# Patient Record
Sex: Female | Born: 1965
Health system: Southern US, Community
[De-identification: ages and names within clinical notes are randomized; demographics above are authoritative.]

## PROBLEM LIST (undated history)

## (undated) DIAGNOSIS — K219 Gastro-esophageal reflux disease without esophagitis: Secondary | ICD-10-CM

## (undated) DIAGNOSIS — T7840XA Allergy, unspecified, initial encounter: Secondary | ICD-10-CM

## (undated) DIAGNOSIS — IMO0001 Reserved for inherently not codable concepts without codable children: Secondary | ICD-10-CM

## (undated) DIAGNOSIS — F419 Anxiety disorder, unspecified: Secondary | ICD-10-CM

## (undated) DIAGNOSIS — R7611 Nonspecific reaction to tuberculin skin test without active tuberculosis: Secondary | ICD-10-CM

## (undated) HISTORY — DX: Reserved for inherently not codable concepts without codable children: IMO0001

## (undated) HISTORY — DX: Anxiety disorder, unspecified: F41.9

## (undated) HISTORY — DX: Allergy, unspecified, initial encounter: T78.40XA

## (undated) HISTORY — DX: Nonspecific reaction to tuberculin skin test without active tuberculosis: R76.11

## (undated) HISTORY — DX: Gastro-esophageal reflux disease without esophagitis: K21.9

---

## 2009-10-14 ENCOUNTER — Encounter: Admission: RE | Admit: 2009-10-14 | Discharge: 2009-10-14 | Payer: Self-pay | Admitting: Emergency Medicine

## 2010-08-03 ENCOUNTER — Encounter: Payer: Self-pay | Admitting: Emergency Medicine

## 2011-06-22 ENCOUNTER — Encounter (INDEPENDENT_AMBULATORY_CARE_PROVIDER_SITE_OTHER): Payer: BC Managed Care – PPO | Admitting: Physician Assistant

## 2011-06-22 DIAGNOSIS — Z0000 Encounter for general adult medical examination without abnormal findings: Secondary | ICD-10-CM

## 2011-08-30 ENCOUNTER — Ambulatory Visit: Payer: BC Managed Care – PPO | Admitting: Family Medicine

## 2011-08-30 DIAGNOSIS — Z9289 Personal history of other medical treatment: Secondary | ICD-10-CM

## 2011-08-30 DIAGNOSIS — J0100 Acute maxillary sinusitis, unspecified: Secondary | ICD-10-CM

## 2011-08-30 DIAGNOSIS — J329 Chronic sinusitis, unspecified: Secondary | ICD-10-CM

## 2011-08-30 DIAGNOSIS — R7611 Nonspecific reaction to tuberculin skin test without active tuberculosis: Secondary | ICD-10-CM

## 2011-08-30 DIAGNOSIS — Z228 Carrier of other infectious diseases: Secondary | ICD-10-CM

## 2011-08-30 MED ORDER — AMOXICILLIN 875 MG PO TABS: 875.0000 mg | ORAL_TABLET | Freq: Two times a day (BID) | ORAL | Status: AC

## 2011-08-30 MED ORDER — BENZONATATE 100 MG PO CAPS: 100.0000 mg | ORAL_CAPSULE | Freq: Two times a day (BID) | ORAL | Status: AC | PRN

## 2011-08-30 MED ORDER — FLUTICASONE PROPIONATE 50 MCG/ACT NA SUSP: 2.0000 | Freq: Every day | NASAL | Status: DC

## 2011-08-30 NOTE — Progress Notes (Signed)
  Subjective:    Patient ID: Terri Wall, female    DOB: 1966-04-08, 46 y.o.   MRN: [id number]  Cough This is a recurrent problem. The current episode started 1 to 4 weeks ago. The problem has been waxing and waning. The problem occurs every few hours. The cough is non-productive. Associated symptoms include postnasal drip and a sore throat. Pertinent negatives include no fever, nasal congestion or rhinorrhea.  Cough preceded by URI  Acknowledges GERD symptoms and at times nocturnal cough if patient indulges in fried foods or eats too late in the evening.(Dr. Elnoria Howard)  Denies wheezing  Works in Customer service manager  History of +PPD in the past.  Husband concerned that current symptoms may represent activation of latent TB. 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  Review of Systems  Constitutional: Negative for fever.  HENT: Positive for sore throat and postnasal drip. Negative for rhinorrhea.   Respiratory: Positive for cough.        Objective:   Physical Exam  Constitutional: She appears well-developed and well-nourished.  HENT:  Right Ear: Tympanic membrane is retracted.  Left Ear: Tympanic membrane is retracted.  Nose: Mucosal edema: inflammation of the nasal turbinates.  Mouth/Throat: Oropharyngeal exudate: purulent pnd.  Eyes: Conjunctivae are normal.  Cardiovascular: Normal rate, regular rhythm and normal heart sounds.   Pulmonary/Chest: Effort normal and breath sounds normal.  Lymphadenopathy:    She has cervical adenopathy.  Neurological: She is alert.  Skin: Skin is warm.  Psychiatric: She has a normal mood and affect.          Assessment & Plan:   1. Sinusitis  fluticasone (FLONASE) 50 MCG/ACT nasal spray, benzonatate (TESSALON) 100 MG capsule, amoxicillin (AMOXIL) 875 MG tablet  2. History of positive PPD  Dicussed drawing interferon TB gold to discern whether TB is latent or cross  reactivity with BCG.  Secondary to the language barrier I asked patient to RTC with her husband to discuss with Dr. Lindi Adie MD) their concerns regarding +PPD. Patient in agreement with this plan.

## 2011-08-30 NOTE — Patient Instructions (Signed)
Return to see Dr. Cleta Alberts in 2 weeks after completing antibiotics to discuss you and your husbands questions regarding your positive TB skin test in the past.

## 2011-09-12 ENCOUNTER — Other Ambulatory Visit: Payer: Self-pay | Admitting: Family Medicine

## 2011-09-20 ENCOUNTER — Ambulatory Visit (INDEPENDENT_AMBULATORY_CARE_PROVIDER_SITE_OTHER): Payer: BC Managed Care – PPO | Admitting: Internal Medicine

## 2011-09-20 VITALS — BP 98/61 | HR 75 | Temp 98.0°F | Resp 16 | Ht 61.5 in | Wt 95.0 lb

## 2011-09-20 DIAGNOSIS — R05 Cough: Secondary | ICD-10-CM

## 2011-09-20 DIAGNOSIS — E559 Vitamin D deficiency, unspecified: Secondary | ICD-10-CM | POA: Insufficient documentation

## 2011-09-20 DIAGNOSIS — R059 Cough, unspecified: Secondary | ICD-10-CM

## 2011-09-20 DIAGNOSIS — J329 Chronic sinusitis, unspecified: Secondary | ICD-10-CM

## 2011-09-20 MED ORDER — AMOXICILLIN 500 MG PO CAPS: 1000.0000 mg | ORAL_CAPSULE | Freq: Two times a day (BID) | ORAL | Status: AC

## 2011-09-20 MED ORDER — BENZONATATE 200 MG PO CAPS: 200.0000 mg | ORAL_CAPSULE | Freq: Two times a day (BID) | ORAL | Status: AC | PRN

## 2011-09-20 NOTE — Progress Notes (Signed)
  Subjective:    Patient ID: Terri Wall, female    DOB: 1966-03-06, 46 y.o.   MRN: [id number]  HPI Pesistent sinusitis and cough Is vit. D deficient, now taking vit. d   Review of Systems Stable    Objective:   Physical Exam  Mild sinus tenderness      Assessment & Plan:   Sinusitis Cough Vitamin D def.  Amoxil rx Tesslon rx Continue vit d

## 2011-11-09 ENCOUNTER — Ambulatory Visit: Payer: BC Managed Care – PPO

## 2011-11-09 ENCOUNTER — Ambulatory Visit (INDEPENDENT_AMBULATORY_CARE_PROVIDER_SITE_OTHER): Payer: BC Managed Care – PPO | Admitting: Family Medicine

## 2011-11-09 VITALS — BP 110/71 | HR 91 | Temp 98.4°F | Resp 16 | Ht 61.5 in | Wt 94.0 lb

## 2011-11-09 DIAGNOSIS — J40 Bronchitis, not specified as acute or chronic: Secondary | ICD-10-CM

## 2011-11-09 DIAGNOSIS — K219 Gastro-esophageal reflux disease without esophagitis: Secondary | ICD-10-CM

## 2011-11-09 DIAGNOSIS — R059 Cough, unspecified: Secondary | ICD-10-CM

## 2011-11-09 DIAGNOSIS — R05 Cough: Secondary | ICD-10-CM

## 2011-11-09 MED ORDER — AZITHROMYCIN 250 MG PO TABS: ORAL_TABLET | ORAL | Status: AC

## 2011-11-09 MED ORDER — HYDROCODONE-HOMATROPINE 5-1.5 MG/5ML PO SYRP: 5.0000 mL | ORAL_SOLUTION | Freq: Four times a day (QID) | ORAL | Status: AC | PRN

## 2011-11-09 NOTE — Patient Instructions (Signed)
Bronchitis Bronchitis is the body's way of reacting to injury and/or infection (inflammation) of the bronchi. Bronchi are the air tubes that extend from the windpipe into the lungs. If the inflammation becomes severe, it may cause shortness of breath. CAUSES  Inflammation may be caused by:  A virus.   Germs (bacteria).   Dust.   Allergens.   Pollutants and many other irritants.  The cells lining the bronchial tree are covered with tiny hairs (cilia). These constantly beat upward, away from the lungs, toward the mouth. This keeps the lungs free of pollutants. When these cells become too irritated and are unable to do their job, mucus begins to develop. This causes the characteristic cough of bronchitis. The cough clears the lungs when the cilia are unable to do their job. Without either of these protective mechanisms, the mucus would settle in the lungs. Then you would develop pneumonia. Smoking is a common cause of bronchitis and can contribute to pneumonia. Stopping this habit is the single most important thing you can do to help yourself. TREATMENT   Your caregiver may prescribe an antibiotic if the cough is caused by bacteria. Also, medicines that open up your airways make it easier to breathe. Your caregiver may also recommend or prescribe an expectorant. It will loosen the mucus to be coughed up. Only take over-the-counter or prescription medicines for pain, discomfort, or fever as directed by your caregiver.   Removing whatever causes the problem (smoking, for example) is critical to preventing the problem from getting worse.   Cough suppressants may be prescribed for relief of cough symptoms.   Inhaled medicines may be prescribed to help with symptoms now and to help prevent problems from returning.   For those with recurrent (chronic) bronchitis, there may be a need for steroid medicines.  SEEK IMMEDIATE MEDICAL CARE IF:   During treatment, you develop more pus-like mucus  (purulent sputum).   You have a fever.   Your baby is older than 3 months with a rectal temperature of 102 F (38.9 C) or higher.   Your baby is 52 months old or younger with a rectal temperature of 100.4 F (38 C) or higher.   You become progressively more ill.   You have increased difficulty breathing, wheezing, or shortness of breath.  It is necessary to seek immediate medical care if you are elderly or sick from any other disease. MAKE SURE YOU:   Understand these instructions.   Will watch your condition.   Will get help right away if you are not doing well or get worse.  Document Released: 06/29/2005 Document Revised: 06/18/2011 Document Reviewed: 05/08/2008 [facility] Patient Information 2012 [place], [place].Vim ph? qu?n (Bronchitis) Vim ph? qu?n l cch c? th? ph?n ?ng l?i s? t?n th??ng v/ho?c nhi?m trng (vim) c?a ph? qu?n. Ph? qu?n l ?ng d?n kh ko di t? kh qu?n ??n ph?i. Khi vim nhi?m n?ng c th? gy kh th?. NGUYN NHN S? vim nhi?m c th? gy ra b?i:  Vi rt.   Vi trng (vi khu?n).   B?i.   Ch?t gy d? ?ng.   Ch?t b?n v nhi?u ch?t kch thch khc.  Cc t? bo t?o thnh cu?ng ph?i ???c bao b?c b?i lng t? (mao). Nh?ng lng t? ny lin t?c ??p ln ra xa ph?i v? pha mi?ng. Nh? ? ng?n khng cho cc ch?t b?n ?i vo ph?i. Khi nh?ng t? bo ny tr? nn qu kch thch v khng th? th?c hi?n ch?c n?ng c?a chng,  d?ch nh?y s? b?t ??u pht tri?n. ?i?u ny gy ra ho ??c tr?ng c?a vim ph? qu?n. Ho s? lm s?ch ph?i khi lng mao khng th? th?c hi?n ch?c n?ng c?a chng. Khi khng c c? hai c? c?u b?o v? ny, d?ch nh?y s? ??ng l?i trong ph?i. Khi ? vim ph?i s? pht tri?n.  Ht thu?c l l nguyn nhn ph? bi?n c?a vim ph? qu?n v c th? gp ph?n vo vim ph?i. Vi?c d?ng thi quen ny l m?t ?i?u quan tr?ng nh?t m b?n c th? lm ?? t? gip mnh. ?I?U TR?  Chuyn gia ch?m [place] y t? c th? k thu?c khng sinh cho b?n n?u b?n b? ho do vi khu?n. Ngoi ra, thu?c gip lm  gin kh qu?n ?? b?n th? d? h?n. Chuyn gia ch?m [place] y t? c?ng c th? ?? xu?t ho?c k thu?c long ??m. Thu?c long ??m s? lm l?ng d?ch nh?y ?? c th? ho ra. Ch? s? d?ng thu?c mua tr?c ti?p t?i qu?y ho?c thu?c theo toa ?? gi?m ?au, gi?m s? kh ch?u ho?c h? s?t theo ch? d?n c?a chuyn gia ch?m [place] y t? c?a b?n.   Vi?c lo?i b? b?t c? ?i?u g gy ra v?n ?? (v d? nh? ht thu?c l) c  ngh?a quan tr?ng ??i v?i vi?c ng?n khng lm cho v?n ?? t?i t? h?n.   Thu?c ho c th? ???c k ?? ?i?u tr? tri?u ch?ng ho.   Thu?c ht c th? ???c k ?? gip ?i?u tr? cc tri?u ch?ng hi?n t?i v ?? gip ng?n ng?a v?n ?? l?p l?i.   Nh?ng ng??i b? vim ph? qu?n mn tnh (ti h?i) c th? c?n ph?i dng thu?c steroid.  HY NGAY L?P T?C THAM V?N V?I CHUYN GIA Y T? N?U:  Trong qu trnh ?i?u tr? b?n pht tri?n ??m gi?ng m? h?n (c m?).   B?n b? s?t.   Tr? h?n 3 thng tu?i c nhi?t ?? ?o ? h?u mn l 102 F (38,9 C) ho?c cao h?n.   Tr? 3 thng tu?i ho?c nh? h?n c nhi?t ?? ?o ? h?u mn l 100,4 F (38 C) ho?c cao h?n.   Tnh tr?ng b?nh c?a b?n tr? nn n?ng h?n.   B?n th?y kh th? h?n, th? kh kh hay kh th?.  C?n tham v?n v?i chuyn gia y t? ngay l?p t?c n?u b?n l ng??i l?n tu?i ho?c ?ang b? b?t k? b?nh no khc. ??M B?O B?N:   Hi?u cc h??ng d?n ny.   S? theo di tnh tr?ng c?a mnh.   S? yu c?u tr? gip ngay l?p t?c n?u b?n c?m th?y khng kh?e ho?c tnh tr?ng tr? nn t?i t? h?n.  Document Released: 06/29/2005 Document Revised: 06/18/2011 [facility] Patient Information 2012 [place], [place].

## 2011-11-09 NOTE — Progress Notes (Signed)
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  She presents with worsening cough x 3 weeks.  No known fever, but has had recent chills.  Has taken claritin without much help. Associated with general weakness. Patient notes acid reflux but has carafate for that  O:  NAD Chest:  Few rales right base Tm's:  Old scars Oroph:  Clear Heart:  Reg, no murmur Neck:  Supple, no adenopathy cervical or supraclav nodes. [facility] reading (PRIMARY) by  Dr. Milus Glazier - CXR:  No infiltrates  A:  Acute bronchitis, language barrier.  P:  z pak and hydromet

## 2012-05-02 ENCOUNTER — Ambulatory Visit (INDEPENDENT_AMBULATORY_CARE_PROVIDER_SITE_OTHER): Payer: BC Managed Care – PPO | Admitting: Family Medicine

## 2012-05-02 DIAGNOSIS — Z23 Encounter for immunization: Secondary | ICD-10-CM

## 2012-11-07 ENCOUNTER — Ambulatory Visit (INDEPENDENT_AMBULATORY_CARE_PROVIDER_SITE_OTHER): Payer: BC Managed Care – PPO | Admitting: Family Medicine

## 2012-11-07 VITALS — BP 102/70 | HR 71 | Temp 97.6°F | Resp 16 | Ht 63.8 in | Wt 97.0 lb

## 2012-11-07 DIAGNOSIS — E559 Vitamin D deficiency, unspecified: Secondary | ICD-10-CM

## 2012-11-07 DIAGNOSIS — Z0000 Encounter for general adult medical examination without abnormal findings: Secondary | ICD-10-CM

## 2012-11-07 LAB — POCT CBC
Granulocyte percent: 58.1 %G (ref 37–80)
HCT, POC: 43.2 % (ref 37.7–47.9)
Lymph, poc: 1.8 (ref 0.6–3.4)
MCHC: 33.6 g/dL (ref 31.8–35.4)
MID (cbc): 0.4 (ref 0–0.9)
POC LYMPH PERCENT: 34.5 %L (ref 10–50)
Platelet Count, POC: 253 10*3/uL (ref 142–424)
RBC: 4.73 M/uL (ref 4.04–5.48)
RDW, POC: 13.1 %

## 2012-11-07 LAB — COMPREHENSIVE METABOLIC PANEL
ALT: 9 U/L (ref 0–35)
Calcium: 9.6 mg/dL (ref 8.4–10.5)
Chloride: 106 mEq/L (ref 96–112)
Creat: 0.58 mg/dL (ref 0.50–1.10)
Sodium: 140 mEq/L (ref 135–145)
Total Bilirubin: 1.2 mg/dL (ref 0.3–1.2)
Total Protein: 6.9 g/dL (ref 6.0–8.3)

## 2012-11-07 LAB — LIPID PANEL
Cholesterol: 184 mg/dL (ref 0–200)
HDL: 69 mg/dL (ref >39)
LDL Cholesterol: 101 mg/dL — ABNORMAL HIGH (ref 0–99)
Total CHOL/HDL Ratio: 2.7 Ratio

## 2012-11-07 LAB — TSH: TSH: 1.048 u[IU]/mL (ref 0.350–4.500)

## 2012-11-07 NOTE — Progress Notes (Signed)
  Subjective:    Patient ID: Terri Wall, female    DOB: 1966/04/10, 47 y.o.   MRN: [id number]  HPI  Had mammogram last year, was normal.  Reports she had pap smear done last year here and all prior have been normal.  Periods are normal and regular.  Does do monthly self-breast exams. Takes multivitamin daily.  Takes vit D supplement for a while but stopped and getting plenty of sun.      Review of Systems    BP 102/70  Pulse 71  Temp(Src) 97.6 F (36.4 C) (Oral)  Resp 16  Ht 5' 3.8" (1.621 m)  Wt 97 lb (43.999 kg)  BMI 16.74 kg/m2  SpO2 100%  LMP 10/16/2012 Objective:   Physical Exam        Assessment & Plan:  tdap 2007, last normal pap was Dec 2012 so recehck Dec 2015 - 1 1/2 yrs.

## 2012-11-07 NOTE — Patient Instructions (Addendum)
You will need your pap smear repeated in 1 1/2 yrs - around December 2015.  You will need a tetanus shot 2017.    Keeping You Healthy  Get These Tests 1. Blood Pressure- Have your blood pressure checked once a year by your health care provider.  Normal blood pressure is 120/80. 2. Weight- Have your body mass index (BMI) calculated to screen for obesity.  BMI is measure of body fat based on height and weight.  You can also calculate your own BMI at https://www.west-esparza.com/. 3. Cholesterol- Have your cholesterol checked every 5 years starting at age 63 then yearly starting at age 47. 4. Chlamydia, HIV, and other sexually transmitted diseases- Get screened every year until age 69, then within three months of each new sexual provider. 5. Pap Smear- Every 3 years; discuss with your health care provider. 6. Mammogram- Every year starting at age 7  Take these medicines  Calcium with Vitamin D-Your body needs 1200 mg of Calcium each day and [phone] IU of Vitamin D daily.  Your body can only absorb 500 mg of Calcium at a time so Calcium must be taken in 2 or 3 divided doses throughout the day.  Multivitamin with folic acid- Once daily if it is possible for you to become pregnant.  Get these Immunizations  Gardasil-Series of three doses; prevents HPV related illness such as genital warts and cervical cancer.  Menactra-Single dose; prevents meningitis.  Tetanus shot- Every 10 years.  Flu shot-Every year.  Take these steps 1. Do not smoke-Your healthcare provider can help you quit.  For tips on how to quit go to www.smokefree.gov or call 1-800 QUITNOW. 2. Be physically active- Exercise 5 days a week for at least 30 minutes.  If you are not already physically active, start slow and gradually work up to 30 minutes of moderate physical activity.  Examples of moderate activity include walking briskly, dancing, swimming, bicycling, etc. 3. Breast Cancer- A self breast exam every month is important  for early detection of breast cancer.  For more information and instruction on self breast exams, ask your healthcare provider or [place]. 4. Eat a healthy diet- Eat a variety of healthy foods such as fruits, vegetables, whole grains, low fat milk, low fat cheeses, yogurt, lean meats, poultry and fish, beans, nuts, tofu, etc.  For more information go to www. Thenutritionsource.org 5. Drink alcohol in moderation- Limit alcohol intake to one drink or less per day. Never drink and drive. 6. Depression- Your emotional health is as important as your physical health.  If you're feeling down or losing interest in things you normally enjoy please talk to your healthcare provider about being screened for depression. 7. Dental visit- Brush and floss your teeth twice daily; visit your dentist twice a year. 8. Eye doctor- Get an eye exam at least every 2 years. 9. Helmet use- Always wear a helmet when riding a bicycle, motorcycle, rollerblading or skateboarding. 10. Safe sex- If you may be exposed to sexually transmitted infections, use a condom. 11. Seat belts- Seat belts can save your live; always wear one. 12. Smoke/Carbon Monoxide detectors- These detectors need to be installed on the appropriate level of your home. Replace batteries at least once a year. 13. Skin cancer- When out in the sun please cover up and use sunscreen 15 SPF or higher. 14. Violence- If anyone is threatening or hurting you, please tell your healthcare provider.

## 2012-11-08 LAB — VITAMIN D 25 HYDROXY (VIT D DEFICIENCY, FRACTURES): Vit D, 25-Hydroxy: 32 ng/mL (ref 30–89)

## 2012-11-12 ENCOUNTER — Telehealth: Payer: Self-pay

## 2012-11-12 NOTE — Telephone Encounter (Signed)
Patient husband returning call about labs results. Cb# [phone]

## 2012-11-12 NOTE — Telephone Encounter (Signed)
See labs 

## 2013-04-25 ENCOUNTER — Ambulatory Visit (INDEPENDENT_AMBULATORY_CARE_PROVIDER_SITE_OTHER): Payer: BC Managed Care – PPO | Admitting: Family Medicine

## 2013-04-25 DIAGNOSIS — Z23 Encounter for immunization: Secondary | ICD-10-CM

## 2013-11-21 ENCOUNTER — Ambulatory Visit: Payer: BC Managed Care – PPO | Admitting: Family Medicine

## 2013-11-21 VITALS — BP 110/70 | HR 72 | Temp 97.8°F | Resp 16 | Ht 61.5 in | Wt 97.2 lb

## 2013-11-21 DIAGNOSIS — R7611 Nonspecific reaction to tuberculin skin test without active tuberculosis: Secondary | ICD-10-CM

## 2013-11-21 DIAGNOSIS — R05 Cough: Secondary | ICD-10-CM

## 2013-11-21 DIAGNOSIS — R059 Cough, unspecified: Secondary | ICD-10-CM

## 2013-11-21 DIAGNOSIS — K219 Gastro-esophageal reflux disease without esophagitis: Secondary | ICD-10-CM

## 2013-11-21 MED ORDER — HYDROCODONE-HOMATROPINE 5-1.5 MG/5ML PO SYRP: 5.0000 mL | ORAL_SOLUTION | Freq: Every evening | ORAL | Status: DC | PRN

## 2013-11-21 MED ORDER — RANITIDINE HCL 150 MG PO TABS: 150.0000 mg | ORAL_TABLET | Freq: Two times a day (BID) | ORAL | Status: DC | PRN

## 2013-11-21 MED ORDER — BENZONATATE 200 MG PO CAPS: 200.0000 mg | ORAL_CAPSULE | Freq: Three times a day (TID) | ORAL | Status: DC | PRN

## 2013-11-21 MED ORDER — CETIRIZINE HCL 10 MG PO TABS: 10.0000 mg | ORAL_TABLET | Freq: Every day | ORAL | Status: DC

## 2013-11-21 NOTE — Progress Notes (Addendum)
This chart was scribed for Terri SorensonEva Garron Eline MD by Terri ConchStephen Wall, ED Scribe. This patient was seen in room 13 and the patient's care was started at 12:24 PM .  Subjective:    Patient ID: Terri CousinsNgoc Wall, female    DOB: [birth year], 48 y.o.   MRN: [id number]   Chief Complaint  Patient presents with  . Cough    x 3 days    HPI   HPI Comments: Terri Wall is a 48 y.o. female who presents to the [facility] complaining of a dry cough that has been present for 3 days and is worsened by activity. She does not feel ill.  Maybe some slight nasal pressure but no congestion or PND. She denies any ShOB, fever, sinus pain, sleep disturbance, and wheezes. She has been taking OTC similar to NyQuil and reports that it provides some effect. She reports having taking a ZPAC that has provided effect in the past.   She also reports that she has GERD and that it causes her throat to be sore. She states that her "acid problem" is worse when she eats late. She has been taking Colloidal Aluminium Phosphate (gets from [name] for her sore throat and reports that it has provided some effect.  Sleeps with a lot of pillows. Was rx'ed med prev (carafate) and didn't work.  Past Medical History  Diagnosis Date  . Allergy    Current Outpatient Prescriptions on File Prior to Visit  Medication Sig Dispense Refill  . Multiple Vitamins-Minerals (MULTIVITAMIN WITH MINERALS) tablet Take 1 tablet by mouth daily.       No current facility-administered medications on file prior to visit.   Allergies  Allergen Reactions  . Doxycycline Hives  . Tetracyclines & Related Hives       Review of Systems  Constitutional: Negative for fever, chills, activity change, appetite change and fatigue.  HENT: Positive for ear pain (3 days ago and has since resolved. Left ear) and sore throat. Negative for mouth sores, nosebleeds, postnasal drip, rhinorrhea, sinus pressure, sneezing and tinnitus.   Respiratory: Positive for  cough. Negative for chest tightness, shortness of breath and wheezing.   Cardiovascular: Negative for chest pain.  Gastrointestinal: Negative for nausea, vomiting, abdominal pain and diarrhea.       Objective:   Physical Exam  Nursing note and vitals reviewed. Constitutional: She is oriented to person, place, and time. She appears well-developed and well-nourished.  HENT:  Head: Normocephalic and atraumatic.  Right Ear: Tympanic membrane, external ear and ear canal normal.  Left Ear: External ear and ear canal normal. Tympanic membrane is retracted. A middle ear effusion is present.  Nose: Nose normal.  Mouth/Throat: Posterior oropharyngeal erythema (mild) present.  Eyes: Conjunctivae and EOM are normal. No scleral icterus.  Neck: Normal range of motion. Neck supple. No thyromegaly present.  Cardiovascular: Normal rate, regular rhythm, S1 normal, S2 normal and normal heart sounds.  Exam reveals no gallop and no friction rub.   No murmur heard. Pulmonary/Chest: Breath sounds normal. No stridor. She has no wheezes. She has no rales. She exhibits no tenderness.  Abdominal: She exhibits no distension. There is no tenderness. There is no rebound.  Musculoskeletal: Normal range of motion. She exhibits no edema.  Lymphadenopathy:    She has cervical adenopathy (mild anterior and bilateral).  Neurological: She is alert and oriented to person, place, and time. She exhibits normal muscle tone. Coordination normal.  Skin: No rash noted. No erythema.  Psychiatric: She has  a normal mood and affect. Her behavior is normal.    Filed Vitals:   11/21/13 1214  BP: 110/70  Pulse: 72  Temp: 97.8 F (36.6 C)  Resp: 16         Assessment & Plan:  12:37 PM-Discussed treatment plan which includes taking allergy medication and cough suppressant in order to deal with her dry cough.  Discussed plan with pt at bedside and pt agreed to plan.   Cough - pt c/o 3d of cough - suspect either due to PND or  GERD - no sxs of illness w/ this. rec below meds x 2 wks and should resolve. If persists or additional sxs dev, RTC for further eval  History of positive PPD, treatment status unknown - neg CXR 2013  GERD (gastroesophageal reflux disease) - failed carafate, currently using maalox type product prn, start zantac  Meds ordered this encounter  Medications  . benzonatate (TESSALON) 200 MG capsule    Sig: Take 1 capsule (200 mg total) by mouth 3 (three) times daily as needed for cough.    Dispense:  30 capsule    Refill:  0  . HYDROcodone-homatropine (HYCODAN) 5-1.5 MG/5ML syrup    Sig: Take 5 mLs by mouth at bedtime as needed for cough.    Dispense:  60 mL    Refill:  0  . cetirizine (ZYRTEC) 10 MG tablet    Sig: Take 1 tablet (10 mg total) by mouth at bedtime.    Dispense:  30 tablet    Refill:  1  . ranitidine (ZANTAC) 150 MG tablet    Sig: Take 1 tablet (150 mg total) by mouth 2 (two) times daily as needed for heartburn.    Dispense:  60 tablet    Refill:  0    I personally performed the services described in this documentation, which was scribed in my presence. The recorded information has been reviewed and considered, and addended by me as needed.  Terri SorensonEva Brentlee Delage, MD MPH

## 2013-11-21 NOTE — Patient Instructions (Signed)
Diet for Gastroesophageal Reflux Disease, Adult Reflux (acid reflux) is when acid from your stomach flows up into the esophagus. When acid comes in contact with the esophagus, the acid causes irritation and soreness (inflammation) in the esophagus. When reflux happens often or so severely that it causes damage to the esophagus, it is called gastroesophageal reflux disease (GERD). Nutrition therapy can help ease the discomfort of GERD. FOODS OR DRINKS TO AVOID OR LIMIT  Smoking or chewing tobacco. Nicotine is one of the most potent stimulants to acid production in the gastrointestinal tract.  Caffeinated and decaffeinated coffee and black tea.  Regular or low-calorie carbonated beverages or energy drinks (caffeine-free carbonated beverages are allowed).   Strong spices, such as black pepper, white pepper, red pepper, cayenne, curry powder, and chili powder.  Peppermint or spearmint.  Chocolate.  High-fat foods, including meats and fried foods. Extra added fats including oils, butter, salad dressings, and nuts. Limit these to less than 8 tsp per day.  Fruits and vegetables if they are not tolerated, such as citrus fruits or tomatoes.  Alcohol.  Any food that seems to aggravate your condition. If you have questions regarding your diet, call your caregiver or a registered dietitian. OTHER THINGS THAT MAY HELP GERD INCLUDE:   Eating your meals slowly, in a relaxed setting.  Eating 5 to 6 small meals per day instead of 3 large meals.  Eliminating food for a period of time if it causes distress.  Not lying down until 3 hours after eating a meal.  Keeping the head of your bed raised 6 to 9 inches (15 to 23 cm) by using a foam wedge or blocks under the legs of the bed. Lying flat may make symptoms worse.  Being physically active. Weight loss may be helpful in reducing reflux in overweight or obese adults.  Wear loose fitting clothing EXAMPLE MEAL PLAN This meal plan is approximately  2,000 calories based on https://www.bernard.org/ChooseMyPlate.gov meal planning guidelines. Breakfast   cup cooked oatmeal.  1 cup strawberries.  1 cup low-fat milk.  1 oz almonds. Snack  1 cup cucumber slices.  6 oz yogurt (made from low-fat or fat-free milk). Lunch  2 slice whole-wheat bread.  2 oz sliced [place].  2 tsp mayonnaise.  1 cup blueberries.  1 cup snap peas. Snack  6 whole-wheat crackers.  1 oz string cheese. Dinner   cup brown rice.  1 cup mixed veggies.  1 tsp olive oil.  3 oz grilled fish. Document Released: 06/29/2005 Document Revised: 09/21/2011 Document Reviewed: 05/15/2011 [facility] Patient Information 2014 MutualExitCare, MarylandLLC. B?nh Tro Ng??c D? Dy Th?c Qu?n, Ng??i L?n (Gastroesophageal Reflux Diseaes, Adult) B?nh tro ng??c d? dy th?c qu?n (GERD) x?y ra khi axit t? d? dy tro ln th?c qu?n. Khi axit ti?p xc v?i th?c qu?n, axit gy ra ?au (vim) trong th?c qu?n. Theo th?i gian, GERD c th? t?o ra cc l? nh? (cc v?t lot) ? nim m?c th?c qu?n.  NGUYN NHN  Tr?ng l??ng c? th? t?ng. ?i?u ny t?o p l?c ln d? dy, lm t?ng axit t? d? dy vo th?c qu?n.  Ht thu?c l. Ht thu?c l lm t?ng s?n sinh axit trong d? dy.  U?ng r??u. ?y l nguyn nhn lm gi?m p l?c trong c? th?t th?c qu?n d??i (van ho?c vng c? gi?a th?c qu?n v d? dy), cho php axit t? d? dy vo th?c qu?n.  ?n t?i mu?n v b?ng no. Tnh tr?ng ny lm t?ng p l?c c?ng nh?  t?ng s?n sinh axit trong d? dy.  D? t?t c? th?t th?c qu?n d??i. ?i khi khng tm th?y nguyn nhn. TRI?U CH?NG  ?au rt ? ph?n d??i gi?a ng?c pha sau x??ng ?c v ? khu v?c gi?a d? dy. Hi?n t??ng ny c th? x?y ra hai l?n m?t tu?n ho?c th??ng xuyn h?n.  Kh nu?t.  ?au h?ng.  Ho khan.  Cc tri?u ch?ng gi?ng hen suy?n, bao g?m t?c ng?c,kh th? ho?c th? kh kh. CH?N ?ON Chuyn gia ch?m [place] s?c kh?e c th? ch?n ?on GERD d?a trn cc tri?u ch?ng c?a b?n. Trong m?t s? tr??ng h?p, ch?p X quang v cc xt nghi?m khc  c th? ???c ti?n hnh ?? ki?m tra cc bi?n ch?ng ho?c tnh tr?ng c?a d? dy v th?c qu?n. ?I?U TR? Chuyn gia ch?m [place] s?c kh?e c th? khuy?n ngh? dng thu?c khng c?n k ??n ho?c thu?c c?n k ??n ?? gip gi?m s?n sinh axit. Hy h?i chuyn gia ch?m [place] s?c kh?e c?a b?n tr??c khi b?t ??u ho?c dng thm b?t k? lo?i thu?c m?i no. H??NG D?N CH?M [place] T?I NH  Thay ??i cc y?u t? m b?n c th? ki?m sot ???c. H?i chuyn gia ch?m [place] s?c kh?e ?? ???c h??ng d?n v? vi?c gi?m cn, b? thu?c l v s? d?ng r??u.  [place] cc lo?i th?c ph?m v ?? u?ng lm cho cc tri?u ch?ng t?i t? h?n, ch?ng h?n nh?:  ?? u?ng c caffeine ho?c r??u.  S c la.  B?c h ho?c v? b?c h.  T?i v hnh ty.  Th?c ?n [place].  Tri cy h? cam, ch?ng h?n nh? cam, chanh hay chanh ty.  Cc th?c ?n c c chua, ch?ng h?n nh? n??c x?t, ?t, salsa (n??c x?t [place]) v bnh pizza.  Cc lo?i th?c ?n chin xo v nhi?u ch?t bo.  Trnh n?m xu?ng ng? 3 ti?ng tr??c gi? ?i ng? ho?c tr??c khi c m?t gi?c ng? ng?n.  ?n nh?ng b?a ?n nh?, th??ng xuyn h?n thay v cc b?a ?n l?n.  M?c qu?n o r?ng. Khng ?eo b?t c? th? g ch?t quanh th?t l?ng gy p l?c ln d? dy.  Nng ??u gi??ng cao ln t? 6 ??n 8 inch b?ng cc kh?i g? ?? gip b?n ng?. S? d?ng thm g?i s? khng c tc d?ng.  Ch? s? d?ng thu?c khng c?n k ??n ho?c thu?c c?n k ??n ?? gi?m ?au, gi?m c?m gic kh ch?u ho?c h? s?t theo ch? d?n c?a chuyn gia ch?m [place] s?c kh?e c?a b?n.  Khng dng thu?c atpirin, ibuprofen ho?c cc thu?c ch?ng vim khng c steroid (NSAID) khc. HY NGAY L?P T?C ?I KHM N?U:  B?n b? ?au ? cnh tay, c?, hm, r?ng ho?c l?ng.  Hi?n t??ng ?au t?ng ln ho?c thay ??i theo c??ng ?? ho?c th?i gian.  B?n b? bu?n nn, nn ho?c ?? m? hi (tot m? hi).  B?n b? kh th? ho?c ng?t x?u.  Ch?t nn c mu xanh l cy, vng, ?en ho?c trng gi?ng nh? b c ph ho?c mu.  Phn c mu ??, ?? nh? mu ho?c ?en. Nh?ng tri?u ch?ng ny c th? l d?u hi?u c?a cc v?n ?? khc,  ch?ng h?n nh? b?nh tim, ch?y mu d? dy ho?c ch?y mu th?c qu?n. ??M B?O B?N:  Hi?u cc h??ng d?n ny.  S? theo di tnh tr?ng c?a mnh.  S? yu c?u tr? gip ngay l?p t?c n?u b?n c?m th?y  khng ?? ho?c tnh tr?ng tr?m tr?ng h?n. Document Released: 04/08/2005 Document Revised: 03/01/2013 [facility] Patient Information 2014 [place] RiverExitCare, MarylandLLC.

## 2013-12-06 ENCOUNTER — Ambulatory Visit: Payer: BC Managed Care – PPO | Admitting: Physician Assistant

## 2013-12-06 VITALS — BP 112/64 | HR 78 | Temp 98.7°F | Resp 16 | Ht 61.5 in | Wt 97.8 lb

## 2013-12-06 DIAGNOSIS — R059 Cough, unspecified: Secondary | ICD-10-CM

## 2013-12-06 DIAGNOSIS — R05 Cough: Secondary | ICD-10-CM

## 2013-12-06 DIAGNOSIS — R7611 Nonspecific reaction to tuberculin skin test without active tuberculosis: Secondary | ICD-10-CM | POA: Insufficient documentation

## 2013-12-06 DIAGNOSIS — T7840XA Allergy, unspecified, initial encounter: Secondary | ICD-10-CM

## 2013-12-06 DIAGNOSIS — J3089 Other allergic rhinitis: Secondary | ICD-10-CM | POA: Insufficient documentation

## 2013-12-06 DIAGNOSIS — K219 Gastro-esophageal reflux disease without esophagitis: Secondary | ICD-10-CM

## 2013-12-06 DIAGNOSIS — J40 Bronchitis, not specified as acute or chronic: Secondary | ICD-10-CM

## 2013-12-06 HISTORY — DX: Gastro-esophageal reflux disease without esophagitis: K21.9

## 2013-12-06 MED ORDER — BENZONATATE 100 MG PO CAPS: 100.0000 mg | ORAL_CAPSULE | Freq: Three times a day (TID) | ORAL | Status: DC | PRN

## 2013-12-06 MED ORDER — HYDROCOD POLST-CHLORPHEN POLST 10-8 MG/5ML PO LQCR: 5.0000 mL | Freq: Two times a day (BID) | ORAL | Status: DC | PRN

## 2013-12-06 MED ORDER — PREDNISONE 20 MG PO TABS: ORAL_TABLET | ORAL | Status: DC

## 2013-12-06 MED ORDER — AZITHROMYCIN 250 MG PO TABS: ORAL_TABLET | ORAL | Status: DC

## 2013-12-06 NOTE — Progress Notes (Signed)
Subjective:    Patient ID: Terri Wall, female    DOB: 24-Apr-1966, 48 y.o.   MRN: [id number]  HPI Primary Physician: No PCP Per Patient  Chief Complaint: Cough x 2 weeks   HPI: 48 y.o. female with history below presents with 2 week history of cough. Patient initially presented on 11/21/13 with a dry cough that had been present for 3 days and worse with activity. She was diagnosed with cough, GERD, and history of positive PPD with unknown treatment history. She was treated with Zyrtec, Zantac, and Hycodan.   Today she states she continues to have a dry, nonproductive cough. Cough is not associated with time of day. Got into a bad coughing episode this AM and coughed up a little sputum and a tiny "fleck" of blood" in it. Some SOB, although not frequently. SOB is not exertional. Some CP at last OV. Last CP was 1 week ago. CP was not exertional. No associated nausea, vomiting, palpitations, diaphoresis, presyncope, or syncope. Notes an increase in post nasal drip and sneezing. Afebrile. No chills.   Virchow's Triad negative.    Past Medical History  Diagnosis Date  . Allergy   . Reflux   . History of positive PPD, treatment status unknown      Home Meds: Prior to Admission medications   Medication Sig Start Date End Date Taking? Authorizing Provider  Multiple Vitamins-Minerals (MULTIVITAMIN WITH MINERALS) tablet Take 1 tablet by mouth daily.   Yes Historical Provider, MD    Allergies:  Allergies  Allergen Reactions  . Doxycycline Hives  . Tetracyclines & Related Hives    History   Social History  . Marital Status: Unknown    Spouse Name: N/A    Number of Children: N/A  . Years of Education: N/A   Occupational History  . Not on file.   Social History Main Topics  . Smoking status: Never Smoker   . Smokeless tobacco: Never Used  . Alcohol Use: No  . Drug Use: No  . Sexual Activity: Not on file   Other Topics Concern  . Not on file   Social History Narrative  . No  narrative on file     Review of Systems  Constitutional: Negative for fever, chills and fatigue.  HENT: Positive for postnasal drip, sneezing and sore throat. Negative for congestion, rhinorrhea and sinus pressure.   Eyes: Negative for itching.  Respiratory: Positive for cough and shortness of breath. Negative for wheezing.        Cough is dry.    Cardiovascular: Positive for chest pain. Negative for palpitations.       Had CP at last OV, none since.   Gastrointestinal: Positive for nausea. Negative for vomiting, abdominal pain and diarrhea.       Positive for reflux.  Nausea only with coughing episodes.   Musculoskeletal: Negative for myalgias and neck pain.  Neurological: Positive for headaches. Negative for syncope.       Mild headache today.        Objective:   Physical Exam  Physical Exam: Blood pressure 112/64, pulse 78, temperature 98.7 F (37.1 C), temperature source Oral, resp. rate 16, height 5' 1.5" (1.562 m), weight 97 lb 12.8 oz (44.362 kg), last menstrual period 11/15/2013, SpO2 100.00%., Body mass index is 18.18 kg/(m^2). General: Well developed, well nourished, in no acute distress. Head: Normocephalic, atraumatic, eyes without discharge, sclera non-icteric, nares are congested. Bilateral auditory canals clear, TM's are without perforation, pearly grey and translucent with reflective  cone of light bilaterally. Oral cavity moist, posterior pharynx with post nasal drip. No exudate, erythema, or peritonsillar abscess. Uvula midline.  Neck: Supple. No thyromegaly. Full ROM. No lymphadenopathy. No nuchal rigidity.  Lungs: Clear bilaterally to auscultation without wheezes, rales, or rhonchi. Breathing is unlabored. Heart: RRR with S1 S2. No murmurs, rubs, or gallops appreciated. Msk:  Strength and tone normal for age. Extremities/Skin: Warm and dry. No clubbing or cyanosis. No edema. No rashes or suspicious lesions. Neuro: Alert and oriented X 3. Moves all extremities  spontaneously. Gait is normal. CNII-XII grossly in tact. Psych:  Responds to questions appropriately with a normal affect.        Assessment & Plan:  48 year old female with bronchitis, cough, post nasal drip, history of reflux, history of positive PPD with unknown treatment status, and language barrier   1) Bronchitis/cough -Azithromycin 250 MG #6 2 po first day then 1 po next 4 days no RF -Prednisone 20 mg #12 3x2, 2x2, 1x2 no RF -Tussionex 1 tsp po q 12 hours prn cough #90 mL no RF, SED -Tessalon Perles 100 mg 1 po tid prn cough #30 no RF  -Declines CXR  2) History of reflux -Restart Zantac -Advised patient cough may be from reflux  3) History of positive PPD with unknown treatment status -Declines CXR today -Patient states she was advised years ago no treatment needed by PCP -Consider TB Clinic referral   4) Language barrier    Eula Listenyan Hassen Bruun, MHS, PA-C [facility] [address] [place], [place] [address] [phone] [facility] 12/06/2013 4:30 PM

## 2013-12-19 ENCOUNTER — Ambulatory Visit (INDEPENDENT_AMBULATORY_CARE_PROVIDER_SITE_OTHER): Payer: BC Managed Care – PPO | Admitting: Family Medicine

## 2013-12-19 VITALS — BP 116/86 | HR 82 | Temp 98.0°F | Resp 20 | Ht 61.0 in | Wt 98.0 lb

## 2013-12-19 DIAGNOSIS — Z01419 Encounter for gynecological examination (general) (routine) without abnormal findings: Secondary | ICD-10-CM

## 2013-12-19 DIAGNOSIS — Z0000 Encounter for general adult medical examination without abnormal findings: Secondary | ICD-10-CM

## 2013-12-19 DIAGNOSIS — E559 Vitamin D deficiency, unspecified: Secondary | ICD-10-CM

## 2013-12-19 LAB — LIPID PANEL
CHOL/HDL RATIO: 3.1 ratio
CHOLESTEROL: 194 mg/dL (ref 0–200)
HDL: 62 mg/dL (ref >39)
LDL CALC: 114 mg/dL — AB (ref 0–99)
TRIGLYCERIDES: 88 mg/dL (ref <150)
VLDL: 18 mg/dL (ref 0–40)

## 2013-12-19 LAB — COMPREHENSIVE METABOLIC PANEL
ALT: 28 U/L (ref 0–35)
AST: 32 U/L (ref 0–37)
Albumin: 4.3 g/dL (ref 3.5–5.2)
Alkaline Phosphatase: 41 U/L (ref 39–117)
BILIRUBIN TOTAL: 1.6 mg/dL — AB (ref 0.2–1.2)
BUN: 15 mg/dL (ref 6–23)
CO2: 25 meq/L (ref 19–32)
Calcium: 9.1 mg/dL (ref 8.4–10.5)
Chloride: 105 mEq/L (ref 96–112)
Creat: 0.56 mg/dL (ref 0.50–1.10)
GLUCOSE: 85 mg/dL (ref 70–99)
POTASSIUM: 4.1 meq/L (ref 3.5–5.3)
SODIUM: 139 meq/L (ref 135–145)
Total Protein: 6.7 g/dL (ref 6.0–8.3)

## 2013-12-19 LAB — CBC
HCT: 39 % (ref 36.0–46.0)
Hemoglobin: 13.4 g/dL (ref 12.0–15.0)
MCH: 30.2 pg (ref 26.0–34.0)
MCHC: 34.4 g/dL (ref 30.0–36.0)
MCV: 87.8 fL (ref 78.0–100.0)
PLATELETS: 271 10*3/uL (ref 150–400)
RBC: 4.44 MIL/uL (ref 3.87–5.11)
RDW: 13 % (ref 11.5–15.5)
WBC: 6 10*3/uL (ref 4.0–10.5)

## 2013-12-19 NOTE — Progress Notes (Signed)
Subjective:   This chart was scribed for Norberto SorensonEva Florine Sprenkle, MD, by Terri EdwardsAngela Wall, ED Scribe. This patient's care was started at 10:37 AM.    Patient ID: Terri CousinsNgoc Wall, female    DOB: [birth year], 48 y.o.   MRN: [id number]  Chief Complaint  Patient presents with  . Annual Exam    HPI   Terri Wall is a 48 y.o. female [facility].   Her last TDAP was in 2007, and she will need a booster next year.   She had a normal pap in 2012, and she has no h/o abnormal pap smears. She reports she does self-breast exams. Her last mammogram several years ago was normal. She reports her menses are normally very regular and typically have a duration of three days, but she reports her menses is late this month. She is unsure when her mother began menopause.   The pt takes a daily vitamin. She also takes Zantac which she reports as improved her acid reflux.   She denies a family h/o cancer stating her mother, who resides in [name], is an octogenarian.     The pt has not eaten today, and she condones having labs performed today.    She reports her cough has improved; she was treated with a five day course of antibiotics.   She denies urinary symptoms or vaginal discharge.   Terri Wall has one child, a son who is here with her today - also got a CPE, who married last week; his wife still resides in [name]. They are trying to secure his wife a [place].   Past Medical History  Diagnosis Date  . Allergy   . Reflux   . History of positive PPD, treatment status unknown    History reviewed. No pertinent past surgical history.  Current Outpatient Prescriptions on File Prior to Visit  Medication Sig Dispense Refill  . Multiple Vitamins-Minerals (MULTIVITAMIN WITH MINERALS) tablet Take 1 tablet by mouth daily.      Marland Kitchen. azithromycin (ZITHROMAX Z-PAK) 250 MG tablet 2 tabs po first day, then 1 tab po next 4 days  6 tablet  0  . benzonatate (TESSALON PERLES) 100 MG capsule Take 1 capsule  (100 mg total) by mouth 3 (three) times daily as needed for cough.  30 capsule  0  . chlorpheniramine-HYDROcodone (TUSSIONEX PENNKINETIC ER) 10-8 MG/5ML LQCR Take 5 mLs by mouth every 12 (twelve) hours as needed for cough.  90 mL  0  . predniSONE (DELTASONE) 20 MG tablet 3 PO FOR 2 DAYS, 2 PO FOR 2 DAYS, 1 PO FOR 2 DAYS  12 tablet  0   No current facility-administered medications on file prior to visit.    Allergies  Allergen Reactions  . Doxycycline Hives  . Tetracyclines & Related Hives   History reviewed. No pertinent family history.  History   Social History  . Marital Status: Unknown    Spouse Name: N/A    Number of Children: N/A  . Years of Education: N/A   Social History Main Topics  . Smoking status: Never Smoker   . Smokeless tobacco: Never Used  . Alcohol Use: No  . Drug Use: No  . Sexual Activity: None   Other Topics Concern  . None   Social History Narrative  . None    Review of Systems  Constitutional: Negative for fever.  Respiratory: Negative for cough.   Genitourinary: Negative for vaginal discharge and dyspareunia.  All other systems reviewed  and are negative.   Triage Vitals: BP 116/86  Pulse 82  Temp(Src) 98 F (36.7 C) (Oral)  Resp 20  Ht 5\' 1"  (1.549 m)  Wt 98 lb (44.453 kg)  BMI 18.53 kg/m2  SpO2 100%  LMP 11/15/2013    Objective:   Physical Exam  Nursing note and vitals reviewed. Constitutional: She is oriented to person, place, and time. She appears well-developed and well-nourished. No distress.  HENT:  Head: Normocephalic and atraumatic.  Right Ear: Tympanic membrane and external ear normal. No swelling. Tympanic membrane is not injected, not scarred, not erythematous, not retracted and not bulging. No middle ear effusion.  Left Ear: External ear normal. No swelling. Tympanic membrane is scarred. Tympanic membrane is not injected, not erythematous, not retracted and not bulging. A middle ear effusion is present.  Nose: Nose  normal.  Mouth/Throat: Oropharynx is clear and moist. No oropharyngeal exudate.  Eyes: Conjunctivae and EOM are normal.  Neck: Normal range of motion. No tracheal deviation present.  Cardiovascular: Normal rate, regular rhythm, S1 normal and S2 normal.   Pulses:      Dorsalis pedis pulses are 2+ on the right side, and 2+ on the left side.  Pulmonary/Chest: Effort normal and breath sounds normal. No respiratory distress. She has no decreased breath sounds. She has no wheezes. She has no rhonchi. She has no rales.  Abdominal: Soft. Bowel sounds are normal. She exhibits no distension and no mass. There is no hepatosplenomegaly. There is no tenderness. There is no rebound and no guarding.  Genitourinary: Vagina normal and uterus normal. No breast swelling, tenderness, discharge or bleeding. There is no rash, tenderness or lesion on the right labia. There is no rash, tenderness or lesion on the left labia. Right adnexum displays no mass, no tenderness and no fullness. Left adnexum displays no mass, no tenderness and no fullness.  Uterus located very anteriorly with cervix anteroverted.  Minimal amount of bright red menstrual originating from os.  Normal uterus and adnexa to palpation.   Musculoskeletal: Normal range of motion. She exhibits no edema.  Lymphadenopathy:       Head (right side): No submental, no submandibular, no preauricular, no posterior auricular and no occipital adenopathy present.       Head (left side): No submental, no submandibular, no preauricular, no posterior auricular and no occipital adenopathy present.    She has cervical adenopathy.       Right cervical: Superficial cervical adenopathy present.       Left cervical: Superficial cervical adenopathy present.       Right: No supraclavicular adenopathy present.       Left: No supraclavicular adenopathy present.  Neurological: She is alert and oriented to person, place, and time.  Skin: Skin is warm and dry.  Psychiatric: She  has a normal mood and affect. Her behavior is normal.       Assessment & Plan:    Vitamin D deficiency - Plan: Vit D  25 hydroxy (rtn osteoporosis monitoring)  Routine general medical examination at a health care facility - Plan: Vit D  25 hydroxy (rtn osteoporosis monitoring), Lipid panel, Comprehensive metabolic panel, TSH, CBC, Pap IG, CT/NG NAA, and HPV (high risk) - no h/o abnml pap so if this is normal, ok to repeat in 5 yrs. Informed pt ok to wait till 50 to continue routine mammograms as prior baseline was normal. No orders of the defined types were placed in this encounter.    I personally performed the  services described in this documentation, which was scribed in my presence. The recorded information has been reviewed and considered, and addended by me as needed.  Delman Cheadle, MD MPH

## 2013-12-19 NOTE — Patient Instructions (Signed)

## 2013-12-20 LAB — VITAMIN D 25 HYDROXY (VIT D DEFICIENCY, FRACTURES): VIT D 25 HYDROXY: 31 ng/mL (ref 30–89)

## 2013-12-20 LAB — TSH: TSH: 1.525 u[IU]/mL (ref 0.350–4.500)

## 2013-12-21 LAB — PAP IG, CT-NG NAA, HPV HIGH-RISK
Chlamydia Probe Amp: NEGATIVE
GC PROBE AMP: NEGATIVE
HPV DNA HIGH RISK: NOT DETECTED

## 2014-04-20 ENCOUNTER — Ambulatory Visit (INDEPENDENT_AMBULATORY_CARE_PROVIDER_SITE_OTHER): Payer: BC Managed Care – PPO | Admitting: Family Medicine

## 2014-04-20 VITALS — BP 102/62 | HR 71 | Temp 97.2°F | Resp 16 | Ht 62.0 in | Wt 102.0 lb

## 2014-04-20 DIAGNOSIS — R590 Localized enlarged lymph nodes: Secondary | ICD-10-CM

## 2014-04-20 DIAGNOSIS — Z23 Encounter for immunization: Secondary | ICD-10-CM

## 2014-04-20 DIAGNOSIS — H9202 Otalgia, left ear: Secondary | ICD-10-CM

## 2014-04-20 NOTE — Patient Instructions (Signed)
For ear pain/ lymph node pain- take tylenol or ibuprofen Can take over the counter decongestant (like sudafed) for ear pressure Come back in if swelling not resolved in 1 month.   Swollen Lymph Nodes The lymphatic system filters fluid from around cells. It is like a system of blood vessels. These channels carry lymph instead of blood. The lymphatic system is an important part of the immune (disease fighting) system. When people talk about "swollen glands in the neck," they are usually talking about swollen lymph nodes. The lymph nodes are like the little traps for infection. You and your caregiver may be able to feel lymph nodes, especially swollen nodes, in these common areas: the groin (inguinal area), armpits (axilla), and above the clavicle (supraclavicular). You may also feel them in the neck (cervical) and the back of the head just above the hairline (occipital). Swollen glands occur when there is any condition in which the body responds with an allergic type of reaction. For instance, the glands in the neck can become swollen from insect bites or any type of minor infection on the head. These are very noticeable in children with only minor problems. Lymph nodes may also become swollen when there is a tumor or problem with the lymphatic system, such as Hodgkin's disease. TREATMENT   Most swollen glands do not require treatment. They can be observed (watched) for a short period of time, if your caregiver feels it is necessary. Most of the time, observation is not necessary.  Antibiotics (medicines that kill germs) may be prescribed by your caregiver. Your caregiver may prescribe these if he or she feels the swollen glands are due to a bacterial (germ) infection. Antibiotics are not used if the swollen glands are caused by a virus. HOME CARE INSTRUCTIONS   Take medications as directed by your caregiver. Only take over-the-counter or prescription medicines for pain, discomfort, or fever as directed  by your caregiver. SEEK MEDICAL CARE IF:   If you begin to run a temperature greater than 102 F (38.9 C), or as your caregiver suggests. MAKE SURE YOU:   Understand these instructions.  Will watch your condition.  Will get help right away if you are not doing well or get worse. Document Released: 06/19/2002 Document Revised: 09/21/2011 Document Reviewed: 06/29/2005 [facility] Patient Information 2015 [place]ExitCare, [place]LLC. This information is not intended to replace advice given to you by your health care provider. Make sure you discuss any questions you have with your health care provider.

## 2014-04-20 NOTE — Progress Notes (Signed)
   Subjective:    Patient ID: Terri Wall, female    DOB: 1966/06/03, 48 y.o.   MRN: [id number]  HPI This is a pleasant 48 yo female who is brought in by her husband who helps her with translation. The patient presents today with intermittent left ear pain and swollen gland on left side of neck. Her symptoms started 3 days ago. She took a dose of tylenol last night which helped with her ear pain. It is significantly improved today. The swollen lymph node is tender when she touches it and turns her head really far to the right.  She had 1 episode of nausea last week at bedtime. She did not vomit. She has history of GERD and avoids eating close to bedtime.   Review of Systems +sneezing last week, no runny nose, no fever/chills, nonproductive cough last week- now resolved, itchy throat, no throat pain, no headache.    Objective:   Physical Exam  Vitals reviewed. Constitutional: She is oriented to person, place, and time. She appears well-developed and well-nourished. No distress.  HENT:  Head: Normocephalic and atraumatic.  Right Ear: Tympanic membrane, external ear and ear canal normal.  Left Ear: External ear and ear canal normal. Tympanic membrane is retracted.  Nose: Nose normal. Right sinus exhibits no maxillary sinus tenderness and no frontal sinus tenderness. Left sinus exhibits no maxillary sinus tenderness and no frontal sinus tenderness.  Mouth/Throat: Uvula is midline, oropharynx is clear and moist and mucous membranes are normal. No oropharyngeal exudate, posterior oropharyngeal edema, posterior oropharyngeal erythema or tonsillar abscesses.  She sounds slightly congested.    Eyes: Conjunctivae and EOM are normal.  Neck: Normal range of motion. Neck supple.    Cardiovascular: Normal rate and regular rhythm.   Pulmonary/Chest: Effort normal and breath sounds normal.  Musculoskeletal: Normal range of motion.  Lymphadenopathy:    She has cervical adenopathy.  Neurological: She is  alert and oriented to person, place, and time.  Skin: Skin is warm and dry. She is not diaphoretic.  Psychiatric: She has a normal mood and affect. Her behavior is normal. Judgment and thought content normal.      Assessment & Plan:  1. Lymphadenopathy, submandibular -this likely related to viral illness -Provided written and verbal information regarding diagnosis and treatment. -encouraged patient to not palpate, can take OTC analgesics -RTC if not resolved in 1 month  2. Otalgia of left ear -otc analgesics and decongestants PRN -RTC if no improvement in 3-4 days or sooner if worsening.  Flu shot today.  Emi Belfasteborah B. Gessner, FNP-BC  [facility]  04/20/2014 10:27 AM

## 2014-07-02 ENCOUNTER — Ambulatory Visit (INDEPENDENT_AMBULATORY_CARE_PROVIDER_SITE_OTHER): Payer: BC Managed Care – PPO | Admitting: Family Medicine

## 2014-07-02 VITALS — BP 108/60 | HR 75 | Temp 98.0°F | Resp 18 | Ht 61.5 in | Wt 92.4 lb

## 2014-07-02 DIAGNOSIS — F4321 Adjustment disorder with depressed mood: Secondary | ICD-10-CM

## 2014-07-02 DIAGNOSIS — F4329 Adjustment disorder with other symptoms: Secondary | ICD-10-CM

## 2014-07-02 DIAGNOSIS — G4700 Insomnia, unspecified: Secondary | ICD-10-CM

## 2014-07-02 DIAGNOSIS — F4320 Adjustment disorder, unspecified: Secondary | ICD-10-CM

## 2014-07-02 DIAGNOSIS — R630 Anorexia: Secondary | ICD-10-CM

## 2014-07-02 MED ORDER — ALPRAZOLAM 0.25 MG PO TABS: 0.2500 mg | ORAL_TABLET | Freq: Every evening | ORAL | Status: DC | PRN

## 2014-07-02 MED ORDER — MIRTAZAPINE 15 MG PO TABS: 15.0000 mg | ORAL_TABLET | Freq: Every day | ORAL | Status: DC

## 2014-07-02 NOTE — Patient Instructions (Signed)
Grief Reaction Grief is a normal response to the death of someone close to you. Feelings of fear, anger, and guilt can affect almost everyone who loses someone they love. Symptoms of depression are also common. These include problems with sleep, loss of appetite, and lack of energy. These grief reaction symptoms often last for weeks to months after a loss. They may also return during special times that remind you of the person you lost, such as an anniversary or birthday. Anxiety, insomnia, irritability, and deep depression may last beyond the period of normal grief. If you experience these feelings for 6 months or longer, you may have clinical depression. Clinical depression requires further medical attention. If you think that you have clinical depression, you should contact your caregiver. If you have a history of depression or a family history of depression, you are at greater risk of clinical depression. You are also at greater risk of developing clinical depression if the loss was traumatic or the loss was of someone with whom you had unresolved issues.  A grief reaction can become complicated by being blocked. This means being unable to cry or express extreme emotions. This may prolong the grieving period and worsen the emotional effects of the loss. Mourning is a natural event in human life. A healthy grief reaction is one that is not blocked. It requires a time of sadness and readjustment. It is very important to share your sorrow and fear with others, especially close friends and family. Professional counselors and clergy can also help you process your grief. Document Released: 06/29/2005 Document Revised: 11/13/2013 Document Reviewed: 03/09/2006 [employer] Patient Information 2015 [employer]. This information is not intended to replace advice given to you by your health care provider. Make sure you discuss any questions you have with your health care provider.  

## 2014-07-02 NOTE — Progress Notes (Signed)
Chief Complaint:  Chief Complaint  Patient presents with  . Depression    Pt. is here to discuss her depression    HPI: Terri Wall is a 48 y.o. female who is here for  Depression sxs, poor sleep and Poor appetite, she has had depressive sxs since her husband died 1 month ago, he was found in a hotel room with alcohol and was dead. She has a 48 y.o so who will be moving back in with her once his lease is up this new year. She is not doing well. No SI/hI/halluciantion. Would like to get meds to help her eat, has had weight loss.  Wt Readings from Last 3 Encounters:  07/02/14 92 lb 6.4 oz (41.912 kg)  04/20/14 102 lb (46.267 kg)  12/19/13 98 lb (44.453 kg)     Past Medical History  Diagnosis Date  . Allergy   . Reflux   . History of positive PPD, treatment status unknown    History reviewed. No pertinent past surgical history. History   Social History  . Marital Status: Unknown    Spouse Name: N/A    Number of Children: N/A  . Years of Education: N/A   Social History Main Topics  . Smoking status: Never Smoker   . Smokeless tobacco: Never Used  . Alcohol Use: No  . Drug Use: No  . Sexual Activity: None   Other Topics Concern  . None   Social History Narrative   History reviewed. No pertinent family history. Allergies  Allergen Reactions  . Doxycycline Hives  . Tetracyclines & Related Hives   Prior to Admission medications   Medication Sig Start Date End Date Taking? Authorizing Provider  Multiple Vitamins-Minerals (MULTIVITAMIN WITH MINERALS) tablet Take 1 tablet by mouth daily.   Yes Historical Provider, MD     ROS: The patient denies fevers, chills, night sweats, unintentional weight loss, chest pain, palpitations, wheezing, dyspnea on exertion, nausea, vomiting, abdominal pain, dysuria, hematuria, melena, numbness, weakness, or tingling.   All other systems have been reviewed and were otherwise negative with the exception of those mentioned in the  HPI and as above.    PHYSICAL EXAM: Filed Vitals:   07/02/14 1107  BP: 108/60  Pulse: 75  Temp: 98 F (36.7 C)  Resp: 18   Filed Vitals:   07/02/14 1107  Height: 5' 1.5" (1.562 m)  Weight: 92 lb 6.4 oz (41.912 kg)   Body mass index is 17.18 kg/(m^2).  General: Alert, mild distress, tearful HEENT:  Normocephalic, atraumatic, oropharynx patent. EOMI, PERRLA Cardiovascular:  Regular rate and rhythm, no rubs murmurs or gallops.  No Carotid bruits, radial pulse intact. No pedal edema.  Respiratory: Clear to auscultation bilaterally.  No wheezes, rales, or rhonchi.  No cyanosis, no use of accessory musculature GI: No organomegaly, abdomen is soft and non-tender, positive bowel sounds.  No masses. Skin: No rashes. Neurologic: Facial musculature symmetric. Psychiatric: Patient is appropriate throughout our interaction. She is tearful due to her recent loss Lymphatic: No cervical lymphadenopathy Musculoskeletal: Gait intact.   LABS: Results for orders placed or performed in visit on 12/19/13  Vit D  25 hydroxy (rtn osteoporosis monitoring)  Result Value Ref Range   Vit D, 25-Hydroxy 31 30 - 89 ng/mL  Lipid panel  Result Value Ref Range   Cholesterol 194 0 - 200 mg/dL   Triglycerides 88 <960<150 mg/dL   HDL 62 >45>39 mg/dL   Total CHOL/HDL Ratio 3.1 Ratio   VLDL  18 0 - 40 mg/dL   LDL Cholesterol 161114 (H) 0 - 99 mg/dL  Comprehensive metabolic panel  Result Value Ref Range   Sodium 139 135 - 145 mEq/L   Potassium 4.1 3.5 - 5.3 mEq/L   Chloride 105 96 - 112 mEq/L   CO2 25 19 - 32 mEq/L   Glucose, Bld 85 70 - 99 mg/dL   BUN 15 6 - 23 mg/dL   Creat 0.960.56 0.450.50 - 4.091.10 mg/dL   Total Bilirubin 1.6 (H) 0.2 - 1.2 mg/dL   Alkaline Phosphatase 41 39 - 117 U/L   AST 32 0 - 37 U/L   ALT 28 0 - 35 U/L   Total Protein 6.7 6.0 - 8.3 g/dL   Albumin 4.3 3.5 - 5.2 g/dL   Calcium 9.1 8.4 - 81.110.5 mg/dL  TSH  Result Value Ref Range   TSH 1.525 0.350 - 4.500 uIU/mL  CBC  Result Value Ref Range    WBC 6.0 4.0 - 10.5 K/uL   RBC 4.44 3.87 - 5.11 MIL/uL   Hemoglobin 13.4 12.0 - 15.0 g/dL   HCT 91.439.0 78.236.0 - 95.646.0 %   MCV 87.8 78.0 - 100.0 fL   MCH 30.2 26.0 - 34.0 pg   MCHC 34.4 30.0 - 36.0 g/dL   RDW 21.313.0 08.611.5 - 57.815.5 %   Platelets 271 150 - 400 K/uL  Pap IG, CT/NG NAA, and HPV (high risk)  Result Value Ref Range   HPV DNA High Risk Not Detected    Specimen adequacy: SEE NOTE    FINAL DIAGNOSIS: SEE NOTE    COMMENTS: SEE NOTE    Cytotechnologist: SEE NOTE    Chlamydia Probe Amp NEGATIVE    GC Probe Amp NEGATIVE      EKG/XRAY:   [facility]   ASSESSMENT/PLAN: Encounter Diagnoses  Name Primary?  . Stress and adjustment reaction Yes  . Grief reaction   . Poor appetite   . Insomnia    Rx remeron Rx Xanax F/u in 1 month Boost/Ensure TID withmeals for weight gain Advise to seek bereavement counseling, she states she is not ready for this yet since everyt iem she talks about it she starts crying  Gross sideeffects, risk and benefits, and alternatives of medications d/w patient. Patient is aware that all medications have potential sideeffects and we are unable to predict every sideeffect or drug-drug interaction that may occur.  Delila Kuklinski PHUONG, DO 07/02/2014 1:29 PM

## 2014-08-10 ENCOUNTER — Encounter: Payer: Self-pay | Admitting: Family Medicine

## 2014-08-10 ENCOUNTER — Ambulatory Visit (INDEPENDENT_AMBULATORY_CARE_PROVIDER_SITE_OTHER): Payer: BLUE CROSS/BLUE SHIELD | Admitting: Family Medicine

## 2014-08-10 VITALS — BP 97/63 | HR 83 | Temp 98.6°F | Resp 16 | Ht 61.25 in | Wt 100.2 lb

## 2014-08-10 DIAGNOSIS — F4329 Adjustment disorder with other symptoms: Secondary | ICD-10-CM

## 2014-08-10 DIAGNOSIS — R630 Anorexia: Secondary | ICD-10-CM

## 2014-08-10 MED ORDER — MIRTAZAPINE 15 MG PO TABS: 15.0000 mg | ORAL_TABLET | Freq: Every day | ORAL | Status: DC

## 2014-08-10 NOTE — Progress Notes (Signed)
Chief Complaint:  Chief Complaint  Patient presents with  . Medication Check    HPI: Terri Wall is a 49 y.o. female who is here for medication recheck, doing well on remeron, has some dizziness with xanax so stopped , she only tried it once. She took it at the same time she took the remeron She is still having grief issues after the death of her husband. She is doing better eating wise overall. She is gaining weight.   Wt Readings from Last 3 Encounters:  08/10/14 100 lb 3.2 oz (45.45 kg)  07/02/14 92 lb 6.4 oz (41.912 kg)  04/20/14 102 lb (46.267 kg)     Past Medical History  Diagnosis Date  . Allergy   . Reflux   . History of positive PPD, treatment status unknown    History reviewed. No pertinent past surgical history. History   Social History  . Marital Status: Unknown    Spouse Name: N/A    Number of Children: N/A  . Years of Education: N/A   Social History Main Topics  . Smoking status: Never Smoker   . Smokeless tobacco: Never Used  . Alcohol Use: No  . Drug Use: No  . Sexual Activity: None   Other Topics Concern  . None   Social History Narrative   History reviewed. No pertinent family history. Allergies  Allergen Reactions  . Doxycycline Hives  . Tetracyclines & Related Hives   Prior to Admission medications   Medication Sig Start Date End Date Taking? Authorizing Provider  ALPRAZolam (XANAX) 0.25 MG tablet Take 1 tablet (0.25 mg total) by mouth at bedtime as needed for anxiety. Or for insomnia 07/02/14  Yes Thao P Le, DO  mirtazapine (REMERON) 15 MG tablet Take 1 tablet (15 mg total) by mouth at bedtime. 07/02/14  Yes Thao P Le, DO  Multiple Vitamins-Minerals (MULTIVITAMIN WITH MINERALS) tablet Take 1 tablet by mouth daily.   Yes Historical Provider, MD     ROS: The patient denies fevers, chills, night sweats, chest pain, palpitations, wheezing, dyspnea on exertion, nausea, vomiting, abdominal pain, dysuria, hematuria, melena, numbness,  weakness, or tingling.   All other systems have been reviewed and were otherwise negative with the exception of those mentioned in the HPI and as above.    PHYSICAL EXAM: Filed Vitals:   08/10/14 1022  BP: 97/63  Pulse: 83  Temp: 98.6 F (37 C)  Resp: 16   Filed Vitals:   08/10/14 1022  Height: 5' 1.25" (1.556 m)  Weight: 100 lb 3.2 oz (45.45 kg)   Body mass index is 18.77 kg/(m^2).  General: Alert, no acute distress HEENT:  Normocephalic, atraumatic, oropharynx patent. EOMI, PERRLA Cardiovascular:  Regular rate and rhythm, no rubs murmurs or gallops.  No Carotid bruits, radial pulse intact. No pedal edema.  Respiratory: Clear to auscultation bilaterally.  No wheezes, rales, or rhonchi.  No cyanosis, no use of accessory musculature GI: No organomegaly, abdomen is soft and non-tender, positive bowel sounds.  No masses. Skin: No rashes. Neurologic: Facial musculature symmetric. Psychiatric: Patient is appropriate throughout our interaction. Lymphatic: No cervical lymphadenopathy Musculoskeletal: Gait intact.   LABS: Results for orders placed or performed in visit on 12/19/13  Vit D  25 hydroxy (rtn osteoporosis monitoring)  Result Value Ref Range   Vit D, 25-Hydroxy 31 30 - 89 ng/mL  Lipid panel  Result Value Ref Range   Cholesterol 194 0 - 200 mg/dL   Triglycerides 88 <952 mg/dL  HDL 62 >39 mg/dL   Total CHOL/HDL Ratio 3.1 Ratio   VLDL 18 0 - 40 mg/dL   LDL Cholesterol 578114 (H) 0 - 99 mg/dL  Comprehensive metabolic panel  Result Value Ref Range   Sodium 139 135 - 145 mEq/L   Potassium 4.1 3.5 - 5.3 mEq/L   Chloride 105 96 - 112 mEq/L   CO2 25 19 - 32 mEq/L   Glucose, Bld 85 70 - 99 mg/dL   BUN 15 6 - 23 mg/dL   Creat 4.690.56 6.290.50 - 5.281.10 mg/dL   Total Bilirubin 1.6 (H) 0.2 - 1.2 mg/dL   Alkaline Phosphatase 41 39 - 117 U/L   AST 32 0 - 37 U/L   ALT 28 0 - 35 U/L   Total Protein 6.7 6.0 - 8.3 g/dL   Albumin 4.3 3.5 - 5.2 g/dL   Calcium 9.1 8.4 - 41.310.5 mg/dL  TSH    Result Value Ref Range   TSH 1.525 0.350 - 4.500 uIU/mL  CBC  Result Value Ref Range   WBC 6.0 4.0 - 10.5 K/uL   RBC 4.44 3.87 - 5.11 MIL/uL   Hemoglobin 13.4 12.0 - 15.0 g/dL   HCT 24.439.0 01.036.0 - 27.246.0 %   MCV 87.8 78.0 - 100.0 fL   MCH 30.2 26.0 - 34.0 pg   MCHC 34.4 30.0 - 36.0 g/dL   RDW 53.613.0 64.411.5 - 03.415.5 %   Platelets 271 150 - 400 K/uL  Pap IG, CT/NG NAA, and HPV (high risk)  Result Value Ref Range   HPV DNA High Risk Not Detected    Specimen adequacy: SEE NOTE    FINAL DIAGNOSIS: SEE NOTE    COMMENTS: SEE NOTE    Cytotechnologist: SEE NOTE    Chlamydia Probe Amp NEGATIVE    GC Probe Amp NEGATIVE      EKG/XRAY:   [facility]   ASSESSMENT/PLAN: Encounter Diagnoses  Name Primary?  . Stress and adjustment reaction Yes  . Poor appetite    Refill Remeron Can cut xanax in 1/2 tab prn F/u in 6 months  Gross sideeffects, risk and benefits, and alternatives of medications d/w patient. Patient is aware that all medications have potential sideeffects and we are unable to predict every sideeffect or drug-drug interaction that may occur.  LE, THAO PHUONG, DO 08/10/2014 10:59 AM

## 2014-08-21 ENCOUNTER — Telehealth: Payer: Self-pay

## 2014-08-21 NOTE — Telephone Encounter (Signed)
Pt has questions about her medication and would like to talk with dr Conley Rollsle

## 2014-08-21 NOTE — Telephone Encounter (Signed)
Spoke with pt , she has some life insurance forms she wants me to fill out and she is going to taper off remeron , advise to do 1/2 tab daily for the next 2 weeks,

## 2014-09-04 ENCOUNTER — Telehealth: Payer: Self-pay

## 2014-09-04 DIAGNOSIS — Z0271 Encounter for disability determination: Secondary | ICD-10-CM

## 2014-09-04 NOTE — Telephone Encounter (Signed)
Pt wants Dr. Conley RollsLe to know she has stopped taking her ALPRAZolam (XANAX) 0.25 MG tablet [[id number]][[id number]] and mirtazapine (REMERON) 15 MG tablet [[id number]. She is doing much better. She can now eat and sleep. Please advise at [phone]

## 2014-09-05 NOTE — Telephone Encounter (Signed)
Left message ok to stop since we talked about tapering down

## 2014-11-29 ENCOUNTER — Ambulatory Visit (INDEPENDENT_AMBULATORY_CARE_PROVIDER_SITE_OTHER): Payer: BLUE CROSS/BLUE SHIELD

## 2014-11-29 ENCOUNTER — Ambulatory Visit (INDEPENDENT_AMBULATORY_CARE_PROVIDER_SITE_OTHER): Payer: BLUE CROSS/BLUE SHIELD | Admitting: Family Medicine

## 2014-11-29 VITALS — BP 118/74 | HR 75 | Temp 98.7°F | Resp 18 | Ht 61.5 in | Wt 99.2 lb

## 2014-11-29 DIAGNOSIS — S134XXA Sprain of ligaments of cervical spine, initial encounter: Secondary | ICD-10-CM

## 2014-11-29 DIAGNOSIS — M542 Cervicalgia: Secondary | ICD-10-CM

## 2014-11-29 MED ORDER — METHOCARBAMOL 500 MG PO TABS: ORAL_TABLET | ORAL | Status: DC
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
## 2014-11-29 NOTE — Patient Instructions (Addendum)
Apply ice or heat to the neck, depending on which feels better, for the next several days 15 or 20 minutes at a time. Sometimes it helps to alternate the 2  Take over-the-counter ibuprofen 800  mg (200 mg 3) 3 times daily for pain and inflammation if needed  Take methocarbamol 500 mg every 6 or 8 hours as needed for muscle relaxant

## 2014-12-10 ENCOUNTER — Ambulatory Visit (INDEPENDENT_AMBULATORY_CARE_PROVIDER_SITE_OTHER): Payer: BLUE CROSS/BLUE SHIELD | Admitting: Family Medicine

## 2014-12-10 VITALS — BP 104/70 | HR 90 | Temp 98.2°F | Resp 16 | Ht 61.5 in | Wt 99.2 lb

## 2014-12-10 DIAGNOSIS — K625 Hemorrhage of anus and rectum: Secondary | ICD-10-CM | POA: Diagnosis not present

## 2014-12-10 DIAGNOSIS — M5412 Radiculopathy, cervical region: Secondary | ICD-10-CM

## 2014-12-10 DIAGNOSIS — M542 Cervicalgia: Secondary | ICD-10-CM | POA: Diagnosis not present

## 2014-12-10 LAB — POCT CBC
GRANULOCYTE PERCENT: 69.8 % (ref 37–80)
HEMATOCRIT: 43.9 % (ref 37.7–47.9)
Hemoglobin: 14.4 g/dL (ref 12.2–16.2)
LYMPH, POC: 1.7 (ref 0.6–3.4)
MCH, POC: 29.6 pg (ref 27–31.2)
MCHC: 32.9 g/dL (ref 31.8–35.4)
MCV: 90.2 fL (ref 80–97)
MID (CBC): 0.5 (ref 0–0.9)
MPV: 7.4 fL (ref 0–99.8)
PLATELET COUNT, POC: 292 10*3/uL (ref 142–424)
POC Granulocyte: 5 (ref 2–6.9)
POC LYMPH %: 23.8 % (ref 10–50)
POC MID %: 6.4 %M (ref 0–12)
RBC: 4.87 M/uL (ref 4.04–5.48)
RDW, POC: 12.5 %
WBC: 7.1 10*3/uL (ref 4.6–10.2)

## 2014-12-10 LAB — IFOBT (OCCULT BLOOD): IMMUNOLOGICAL FECAL OCCULT BLOOD TEST: POSITIVE

## 2014-12-10 MED ORDER — PREDNISONE 20 MG PO TABS: ORAL_TABLET | ORAL | Status: DC
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
## 2014-12-10 NOTE — Patient Instructions (Addendum)
You will be contacted regarding the scheduling of the MRI for your neck  You'll be contacted regarding the scheduling of seeing a gastroenterologist for your rectal bleeding.  Take the prednisone 3 pills daily for 2 days, then 2 daily for 2 days, then 1 daily for 2 days. Best taken after breakfast. This is to try and relieve the numbness and tingling of the left neck and arm, and to calm neck pain.

## 2014-12-31 ENCOUNTER — Ambulatory Visit (INDEPENDENT_AMBULATORY_CARE_PROVIDER_SITE_OTHER): Payer: BLUE CROSS/BLUE SHIELD | Admitting: Family Medicine

## 2014-12-31 VITALS — BP 100/70 | HR 68 | Temp 98.5°F | Resp 16 | Ht 61.75 in | Wt 98.5 lb

## 2014-12-31 DIAGNOSIS — M79602 Pain in left arm: Secondary | ICD-10-CM

## 2014-12-31 DIAGNOSIS — R200 Anesthesia of skin: Secondary | ICD-10-CM

## 2014-12-31 DIAGNOSIS — M5412 Radiculopathy, cervical region: Secondary | ICD-10-CM

## 2014-12-31 DIAGNOSIS — R208 Other disturbances of skin sensation: Secondary | ICD-10-CM

## 2014-12-31 MED ORDER — DICLOFENAC SODIUM 75 MG PO TBEC: 75.0000 mg | DELAYED_RELEASE_TABLET | Freq: Two times a day (BID) | ORAL | Status: DC
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
## 2014-12-31 NOTE — Patient Instructions (Signed)
Take the diclofenac one pill twice daily with food at breakfast and supper  We will try again tomorrow to be working on scheduling the MRI. If you do not hear from this office by Thursday call back.  Return in 2 weeks.

## 2015-01-03 ENCOUNTER — Telehealth: Payer: Self-pay

## 2015-01-03 NOTE — Telephone Encounter (Signed)
Patient is checking on MRI referral   Okay to [facility]  [phone]

## 2015-01-15 ENCOUNTER — Ambulatory Visit (INDEPENDENT_AMBULATORY_CARE_PROVIDER_SITE_OTHER): Payer: BLUE CROSS/BLUE SHIELD | Admitting: Family Medicine

## 2015-01-15 VITALS — BP 106/70 | HR 84 | Temp 98.3°F | Resp 18 | Ht 61.0 in | Wt 100.0 lb

## 2015-01-15 DIAGNOSIS — M542 Cervicalgia: Secondary | ICD-10-CM | POA: Diagnosis not present

## 2015-01-15 DIAGNOSIS — M5412 Radiculopathy, cervical region: Secondary | ICD-10-CM

## 2015-01-15 NOTE — Patient Instructions (Signed)
I will try to call you back when I get the MRI report. If it is normal, I will want to follow-up here in a few weeks and I will let you know when. If it is not normal, I will need to probably send you to a neurosurgeon.  After you follow-up with Dr. Elnoria HowardHung you do not need to see me if things are going well, but if there are still questions about your stomach please come back and see me.
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
## 2015-01-28 ENCOUNTER — Ambulatory Visit (INDEPENDENT_AMBULATORY_CARE_PROVIDER_SITE_OTHER): Payer: BLUE CROSS/BLUE SHIELD | Admitting: Family Medicine

## 2015-01-28 DIAGNOSIS — M542 Cervicalgia: Secondary | ICD-10-CM

## 2015-01-28 NOTE — Patient Instructions (Addendum)
Referral is being made to physical therapy  Take over-the-counter Aleve 2 pills twice daily with breakfast and with supper  Return in one month for recheck, sooner if problems arise.  This should gradually continue to improve with time, but I hope that physical therapy can help speed you on your way. There is no evidence of anything that should cause permanent problems.
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
## 2015-02-08 ENCOUNTER — Encounter: Payer: Self-pay | Admitting: Family Medicine

## 2015-02-19 ENCOUNTER — Emergency Department (HOSPITAL_COMMUNITY)
Admission: EM | Admit: 2015-02-19 | Discharge: 2015-02-19 | Disposition: A | Discharge status: Home / Self Care | Payer: BLUE CROSS/BLUE SHIELD | Attending: Emergency Medicine | Admitting: Emergency Medicine

## 2015-02-19 ENCOUNTER — Encounter (HOSPITAL_COMMUNITY): Payer: Self-pay

## 2015-02-19 ENCOUNTER — Emergency Department (HOSPITAL_COMMUNITY): Payer: BLUE CROSS/BLUE SHIELD

## 2015-02-19 DIAGNOSIS — S39012A Strain of muscle, fascia and tendon of lower back, initial encounter: Secondary | ICD-10-CM | POA: Diagnosis not present

## 2015-02-19 DIAGNOSIS — Y92410 Unspecified street and highway as the place of occurrence of the external cause: Secondary | ICD-10-CM | POA: Diagnosis not present

## 2015-02-19 DIAGNOSIS — Y998 Other external cause status: Secondary | ICD-10-CM | POA: Diagnosis not present

## 2015-02-19 DIAGNOSIS — F419 Anxiety disorder, unspecified: Secondary | ICD-10-CM | POA: Diagnosis not present

## 2015-02-19 DIAGNOSIS — S4992XA Unspecified injury of left shoulder and upper arm, initial encounter: Secondary | ICD-10-CM | POA: Insufficient documentation

## 2015-02-19 DIAGNOSIS — Z79899 Other long term (current) drug therapy: Secondary | ICD-10-CM | POA: Diagnosis not present

## 2015-02-19 DIAGNOSIS — Y9389 Activity, other specified: Secondary | ICD-10-CM | POA: Diagnosis not present

## 2015-02-19 DIAGNOSIS — S8011XA Contusion of right lower leg, initial encounter: Secondary | ICD-10-CM | POA: Insufficient documentation

## 2015-02-19 DIAGNOSIS — Z8719 Personal history of other diseases of the digestive system: Secondary | ICD-10-CM | POA: Insufficient documentation

## 2015-02-19 DIAGNOSIS — S161XXA Strain of muscle, fascia and tendon at neck level, initial encounter: Secondary | ICD-10-CM | POA: Insufficient documentation

## 2015-02-19 DIAGNOSIS — S199XXA Unspecified injury of neck, initial encounter: Secondary | ICD-10-CM | POA: Diagnosis present

## 2015-02-19 MED ORDER — IBUPROFEN 800 MG PO TABS: 800.0000 mg | ORAL_TABLET | Freq: Three times a day (TID) | ORAL | Status: DC

## 2015-02-19 MED ORDER — ACETAMINOPHEN 500 MG PO TABS
1000.0000 mg | ORAL_TABLET | Freq: Once | ORAL | Status: AC
  Administered 2015-02-19: 1000 mg via ORAL
  Filled 2015-02-19: qty 2
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
## 2015-02-25 ENCOUNTER — Other Ambulatory Visit: Payer: Self-pay

## 2015-02-25 ENCOUNTER — Encounter (HOSPITAL_COMMUNITY): Payer: Self-pay

## 2015-02-25 ENCOUNTER — Emergency Department (HOSPITAL_COMMUNITY)
Admission: EM | Admit: 2015-02-25 | Discharge: 2015-02-25 | Disposition: A | Discharge status: Home / Self Care | Payer: BLUE CROSS/BLUE SHIELD | Attending: Emergency Medicine | Admitting: Emergency Medicine

## 2015-02-25 DIAGNOSIS — M940 Chondrocostal junction syndrome [Tietze]: Secondary | ICD-10-CM

## 2015-02-25 DIAGNOSIS — F419 Anxiety disorder, unspecified: Secondary | ICD-10-CM | POA: Diagnosis not present

## 2015-02-25 DIAGNOSIS — S299XXD Unspecified injury of thorax, subsequent encounter: Secondary | ICD-10-CM | POA: Insufficient documentation

## 2015-02-25 DIAGNOSIS — Z8719 Personal history of other diseases of the digestive system: Secondary | ICD-10-CM | POA: Insufficient documentation

## 2015-02-25 DIAGNOSIS — S4992XD Unspecified injury of left shoulder and upper arm, subsequent encounter: Secondary | ICD-10-CM | POA: Diagnosis present

## 2015-02-25 DIAGNOSIS — S46812D Strain of other muscles, fascia and tendons at shoulder and upper arm level, left arm, subsequent encounter: Secondary | ICD-10-CM

## 2015-02-25 DIAGNOSIS — S8011XD Contusion of right lower leg, subsequent encounter: Secondary | ICD-10-CM | POA: Diagnosis not present

## 2015-02-25 DIAGNOSIS — S46912D Strain of unspecified muscle, fascia and tendon at shoulder and upper arm level, left arm, subsequent encounter: Secondary | ICD-10-CM | POA: Insufficient documentation

## 2015-02-25 LAB — BASIC METABOLIC PANEL
ANION GAP: 9 (ref 5–15)
BUN: 12 mg/dL (ref 6–20)
CO2: 22 mmol/L (ref 22–32)
Calcium: 9.1 mg/dL (ref 8.9–10.3)
Chloride: 108 mmol/L (ref 101–111)
Creatinine, Ser: 0.59 mg/dL (ref 0.44–1.00)
Glucose, Bld: 94 mg/dL (ref 65–99)
POTASSIUM: 3.7 mmol/L (ref 3.5–5.1)
SODIUM: 139 mmol/L (ref 135–145)

## 2015-02-25 LAB — CBC WITH DIFFERENTIAL/PLATELET
BASOS ABS: 0 10*3/uL (ref 0.0–0.1)
BASOS PCT: 0 % (ref 0–1)
EOS ABS: 0.1 10*3/uL (ref 0.0–0.7)
EOS PCT: 1 % (ref 0–5)
HCT: 40.4 % (ref 36.0–46.0)
Hemoglobin: 14 g/dL (ref 12.0–15.0)
Lymphocytes Relative: 31 % (ref 12–46)
Lymphs Abs: 2.1 10*3/uL (ref 0.7–4.0)
MCH: 30.8 pg (ref 26.0–34.0)
MCHC: 34.7 g/dL (ref 30.0–36.0)
MCV: 89 fL (ref 78.0–100.0)
Monocytes Absolute: 0.4 10*3/uL (ref 0.1–1.0)
Monocytes Relative: 5 % (ref 3–12)
Neutro Abs: 4.4 10*3/uL (ref 1.7–7.7)
Neutrophils Relative %: 63 % (ref 43–77)
PLATELETS: 274 10*3/uL (ref 150–400)
RBC: 4.54 MIL/uL (ref 3.87–5.11)
RDW: 12.3 % (ref 11.5–15.5)
WBC: 6.9 10*3/uL (ref 4.0–10.5)

## 2015-02-25 LAB — I-STAT TROPONIN, ED
TROPONIN I, POC: 0.01 ng/mL (ref 0.00–0.08)
TROPONIN I, POC: 0 ng/mL (ref 0.00–0.08)

## 2015-02-25 MED ORDER — NAPROXEN 500 MG PO TABS: 500.0000 mg | ORAL_TABLET | Freq: Two times a day (BID) | ORAL | Status: DC | PRN

## 2015-02-25 MED ORDER — DIAZEPAM 2 MG PO TABS
2.0000 mg | ORAL_TABLET | Freq: Once | ORAL | Status: AC
  Administered 2015-02-25: 2 mg via ORAL
  Filled 2015-02-25: qty 1

## 2015-02-25 MED ORDER — DIAZEPAM 2 MG PO TABS: 2.0000 mg | ORAL_TABLET | Freq: Three times a day (TID) | ORAL | Status: DC | PRN
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
## 2015-02-25 NOTE — Discharge Instructions (Signed)
Take naprosyn as directed for inflammation and pain with tylenol for breakthrough pain and valium for muscle relaxation and anxiety. Do not drive or operate machinery with pain medication or muscle relaxation use. Ice to areas of soreness for the next 24 hours and then may move to heat, no more than 20 minutes at a time every hour for each. Expect to be sore for the next few days and follow up with primary care physician for recheck of ongoing symptoms in the next 1-2 weeks. Return to ER for emergent changing or worsening of symptoms.     Chest Wall Pain Chest wall pain is pain felt in or around the chest bones and muscles. It may take up to 6 weeks to get better. It may take longer if you are active. Chest wall pain can happen on its own. Other times, things like germs, injury, coughing, or exercise can cause the pain. HOME CARE   Avoid activities that make you tired or cause pain. Try not to use your chest, belly (abdominal), or side muscles. Do not use heavy weights.  Put ice on the sore area.  Put ice in a plastic bag.  Place a towel between your skin and the bag.  Leave the ice on for 15-20 minutes for the first 2 days.  Only take medicine as told by your doctor. GET HELP RIGHT AWAY IF:   You have more pain or are very uncomfortable.  You have a fever.  Your chest pain gets worse.  You have new problems.  You feel sick to your stomach (nauseous) or throw up (vomit).  You start to sweat or feel lightheaded.  You have a cough with mucus (phlegm).  You cough up blood. MAKE SURE YOU:   Understand these instructions.  Will watch your condition.  Will get help right away if you are not doing well or get worse. Document Released: 12/16/2007 Document Revised: 09/21/2011 Document Reviewed: 02/23/2011 [facility] Patient Information 2015 [place], [place]. This information is not intended to replace advice given to you by your health care provider. Make sure you discuss any questions  you have with your health care provider.  Costochondritis Costochondritis, sometimes called Tietze syndrome, is a swelling and irritation (inflammation) of the tissue (cartilage) that connects your ribs with your breastbone (sternum). It causes pain in the chest and rib area. Costochondritis usually goes away on its own over time. It can take up to 6 weeks or longer to get better, especially if you are unable to limit your activities. CAUSES  Some cases of costochondritis have no known cause. Possible causes include:  Injury (trauma).  Exercise or activity such as lifting.  Severe coughing. SIGNS AND SYMPTOMS  Pain and tenderness in the chest and rib area.  Pain that gets worse when coughing or taking deep breaths.  Pain that gets worse with specific movements. DIAGNOSIS  Your health care provider will do a physical exam and ask about your symptoms. Chest X-rays or other tests may be done to rule out other problems. TREATMENT  Costochondritis usually goes away on its own over time. Your health care provider may prescribe medicine to help relieve pain. HOME CARE INSTRUCTIONS   Avoid exhausting physical activity. Try not to strain your ribs during normal activity. This would include any activities using chest, abdominal, and side muscles, especially if heavy weights are used.  Apply ice to the affected area for the first 2 days after the pain begins.  Put ice in a plastic bag.  Place a towel between your skin and the bag.  Leave the ice on for 20 minutes, 2-3 times a day.  Only take over-the-counter or prescription medicines as directed by your health care provider. SEEK MEDICAL CARE IF:  You have redness or swelling at the rib joints. These are signs of infection.  Your pain does not go away despite rest or medicine. SEEK IMMEDIATE MEDICAL CARE IF:   Your pain increases or you are very uncomfortable.  You have shortness of breath or difficulty breathing.  You cough up  blood.  You have worse chest pains, sweating, or vomiting.  You have a fever or persistent symptoms for more than 2-3 days.  You have a fever and your symptoms suddenly get worse. MAKE SURE YOU:   Understand these instructions.  Will watch your condition.  Will get help right away if you are not doing well or get worse. Document Released: 04/08/2005 Document Revised: 04/19/2013 Document Reviewed: 01/31/2013 [facility] Patient Information 2015 [place], [place]. This information is not intended to replace advice given to you by your health care provider. Make sure you discuss any questions you have with your health care provider.  Contusion A contusion is a deep bruise. Contusions happen when an injury causes bleeding under the skin. Signs of bruising include pain, puffiness (swelling), and discolored skin. The contusion may turn blue, purple, or yellow. HOME CARE   Put ice on the injured area.  Put ice in a plastic bag.  Place a towel between your skin and the bag.  Leave the ice on for 15-20 minutes, 03-04 times a day.  Only take medicine as told by your doctor.  Rest the injured area.  If possible, raise (elevate) the injured area to lessen puffiness. GET HELP RIGHT AWAY IF:   You have more bruising or puffiness.  You have pain that is getting worse.  Your puffiness or pain is not helped by medicine. MAKE SURE YOU:   Understand these instructions.  Will watch your condition.  Will get help right away if you are not doing well or get worse. Document Released: 12/16/2007 Document Revised: 09/21/2011 Document Reviewed: 05/04/2011 [facility] Patient Information 2015 [place], [place]. This information is not intended to replace advice given to you by your health care provider. Make sure you discuss any questions you have with your health care provider.  Panic Attacks Panic attacks are sudden, short-livedsurges of severe anxiety, fear, or discomfort. They may occur for no  reason when you are relaxed, when you are anxious, or when you are sleeping. Panic attacks may occur for a number of reasons:   Healthy people occasionally have panic attacks in extreme, life-threatening situations, such as war or natural disasters. Normal anxiety is a protective mechanism of the body that helps [place] react to danger (fight or flight response).  Panic attacks are often seen with anxiety disorders, such as panic disorder, social anxiety disorder, generalized anxiety disorder, and phobias. Anxiety disorders cause excessive or uncontrollable anxiety. They may interfere with your relationships or other life activities.  Panic attacks are sometimes seen with other mental illnesses, such as depression and posttraumatic stress disorder.  Certain medical conditions, prescription medicines, and drugs of abuse can cause panic attacks. SYMPTOMS  Panic attacks start suddenly, peak within 20 minutes, and are accompanied by four or more of the following symptoms:  Pounding heart or fast heart rate (palpitations).  Sweating.  Trembling or shaking.  Shortness of breath or feeling smothered.  Feeling choked.  Chest pain  or discomfort.  Nausea or strange feeling in your stomach.  Dizziness, light-headedness, or feeling like you will faint.  Chills or hot flushes.  Numbness or tingling in your lips or hands and feet.  Feeling that things are not real or feeling that you are not yourself.  Fear of losing control or going crazy.  Fear of dying. Some of these symptoms can mimic serious medical conditions. For example, you may think you are having a heart attack. Although panic attacks can be very scary, they are not life threatening. DIAGNOSIS  Panic attacks are diagnosed through an assessment by your health care provider. Your health care provider will ask questions about your symptoms, such as where and when they occurred. Your health care provider will also ask about your medical  history and use of alcohol and drugs, including prescription medicines. Your health care provider may order blood tests or other studies to rule out a serious medical condition. Your health care provider may refer you to a mental health professional for further evaluation. TREATMENT   Most healthy people who have one or two panic attacks in an extreme, life-threatening situation will not require treatment.  The treatment for panic attacks associated with anxiety disorders or other mental illness typically involves counseling with a mental health professional, medicine, or a combination of both. Your health care provider will help determine what treatment is best for you.  Panic attacks due to physical illness usually go away with treatment of the illness. If prescription medicine is causing panic attacks, talk with your health care provider about stopping the medicine, decreasing the dose, or substituting another medicine.  Panic attacks due to alcohol or drug abuse go away with abstinence. Some adults need professional help in order to stop drinking or using drugs. HOME CARE INSTRUCTIONS   Take all medicines as directed by your health care provider.   Schedule and attend follow-up visits as directed by your health care provider. It is important to keep all your appointments. SEEK MEDICAL CARE IF:  You are not able to take your medicines as prescribed.  Your symptoms do not improve or get worse. SEEK IMMEDIATE MEDICAL CARE IF:   You experience panic attack symptoms that are different than your usual symptoms.  You have serious thoughts about hurting yourself or others.  You are taking medicine for panic attacks and have a serious side effect. MAKE SURE YOU:  Understand these instructions.  Will watch your condition.  Will get help right away if you are not doing well or get worse. Document Released: 06/29/2005 Document Revised: 07/04/2013 Document Reviewed: 02/10/2013 [facility]  Patient Information 2015 [place], [place]. This information is not intended to replace advice given to you by your health care provider. Make sure you discuss any questions you have with your health care provider.   Emergency Department Resource Guide 1) Find a Doctor and Pay Out of Pocket Although you won't have to find out who is covered by your insurance plan, it is a good idea to ask around and get recommendations. You will then need to call the office and see if the doctor you have chosen will accept you as a new patient and what types of options they offer for patients who are self-pay. Some doctors offer discounts or will set up payment plans for their patients who do not have insurance, but you will need to ask so you aren't surprised when you get to your appointment.  2) Contact Your Local Health Department Not all  health departments have doctors that can see patients for sick visits, but many do, so it is worth a call to see if yours does. If you don't know where your local health department is, you can check in your phone book. The CDC also has a tool to help you locate your state's health department, and many state websites also have listings of all of their local health departments.  3) Find a Good [facility] If your illness is not likely to be very severe or complicated, you may want to try a walk in clinic. These are popping up all over the country in pharmacies, drugstores, and shopping centers. They're usually staffed by nurse practitioners or physician assistants that have been trained to treat common illnesses and complaints. They're usually fairly quick and inexpensive. However, if you have serious medical issues or chronic medical problems, these are probably not your best option.  No Primary Care Doctor: - Call [facility] Connect at  [phone] - they can help you locate a primary care doctor that  accepts your insurance, provides certain services, etc. - Physician Referral Service-  [phone]  Chronic Pain Problems: Organization         Address  Phone   Notes  [facility]  [phone] Patients need to be referred by their primary care doctor.   Medication Assistance: Organization         Address  Phone   Notes  [facility] Medication [facility] [address] [place], [place] [address] [phone] --Must be a resident of [facility] -- Must have NO insurance coverage whatsoever (no Medicaid/ Medicare, etc.) -- The pt. MUST have a primary care doctor that directs their care regularly and follows them in the community   [facility]  [phone]   [employer]  [phone]    Agencies that provide inexpensive medical care: Organization         Address  Phone   Notes  [place]  [phone]   [facility]    [phone]   [facility] [place], [place] [address] [phone]   [place] [address], [place] [phone]   [facility]    [phone]   [facility]    [phone]   [place] and [address], [place] Phone:  [phone], Fax:  [phone] Hours of Operation:  9 am - 6 pm, M-F.  Also accepts Medicaid/Medicare and self-pay.  [facility] for [place] [address], [address], [place] Phone: [phone], Fax: [phone]. Hours of Operation:  8:30 am - 5:30 pm, M-F.  Also accepts Medicaid and self-pay.  [facility] [place] [address], [place] Phone: [phone]   [place], [place], [place] [phone], Ext. [phone] Mondays & Thursdays: 7-9 AM.  First 15 patients are seen on a first come, first serve basis.    [place] Providers:  Organization         Address  Phone   Notes  [facility] [address],  [place] [phone] Also accepts self-pay patients.  [place], [place]  [phone]   [place], [place], [place] [phone]   [facility] [address], [place] [phone]   Renaye Rakers [address], [place]   [phone] Only accepts [place] patients after they have their name applied to their card.   Self-Pay (no insurance) in [facility]:  Organization         Address  Phone   Notes  Sickle Cell Patients, [facility] [address] [place], [place] [phone]   [facility] [address] [place], [place] [phone]   [facility] [place]  [address], [address], [place] [phone]   [facility]/Dr. Osei-Bonsu  [address] [place] or [address], [place] [phone] Phone number for both [place] and [place] locations is the same.  [facility] [address], [place] [phone]   [facility] [address], [place] or [address] [phone]   [facility] [address] [place] [phone], phone; [phone], fax Sees patients 1st and 3rd Saturday of every month.  Must not qualify for public or private insurance (i.e. Medicaid, Medicare,  Health Choice, Veterans' Benefits)  Household income should be no more than 200% of the poverty level The clinic cannot treat you if you are pregnant or think you are pregnant  Sexually transmitted diseases are not treated at the clinic.
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
## 2015-03-04 ENCOUNTER — Ambulatory Visit (INDEPENDENT_AMBULATORY_CARE_PROVIDER_SITE_OTHER): Payer: BLUE CROSS/BLUE SHIELD | Admitting: Family Medicine

## 2015-03-04 VITALS — BP 108/70 | HR 81 | Temp 98.0°F | Resp 16 | Ht 61.5 in | Wt 96.0 lb

## 2015-03-04 DIAGNOSIS — R519 Headache, unspecified: Secondary | ICD-10-CM

## 2015-03-04 DIAGNOSIS — H9202 Otalgia, left ear: Secondary | ICD-10-CM | POA: Diagnosis not present

## 2015-03-04 DIAGNOSIS — F411 Generalized anxiety disorder: Secondary | ICD-10-CM

## 2015-03-04 DIAGNOSIS — S8012XA Contusion of left lower leg, initial encounter: Secondary | ICD-10-CM | POA: Diagnosis not present

## 2015-03-04 DIAGNOSIS — R590 Localized enlarged lymph nodes: Secondary | ICD-10-CM

## 2015-03-04 DIAGNOSIS — R51 Headache: Secondary | ICD-10-CM | POA: Diagnosis not present

## 2015-03-04 DIAGNOSIS — S20219A Contusion of unspecified front wall of thorax, initial encounter: Secondary | ICD-10-CM

## 2015-03-04 MED ORDER — LORAZEPAM 0.5 MG PO TABS: ORAL_TABLET | ORAL | Status: DC

## 2015-03-04 NOTE — Patient Instructions (Addendum)
Take lorazepam 0.5 mg twice daily to help the nervous shakyness. This can make you a little bit sleepy, but this is a very low dose and I do not think it will be a problem at all when you go to work.  Try to get some exercise every day by taking a little walk which will help relax you also.  Continue taking either the naproxen 500 mg twice daily or ibuprofen 800 mg 3 times daily if needed for the pain.  I think that most of your symptoms will just have to take some time to calm down. The chest sounds clear, but if the cough is getting worse or what you to come back. Otherwise if you're not doing much better in 3 weeks please come back to see either Dr. Clelia Croft or me for a recheck. I think it is okay  I think the ear will calm down on its own. It does not look infected. If he keeps giving you trouble come back.  Come in sooner at any time if not doing better
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
## 2015-03-14 ENCOUNTER — Ambulatory Visit (INDEPENDENT_AMBULATORY_CARE_PROVIDER_SITE_OTHER): Payer: BLUE CROSS/BLUE SHIELD | Admitting: Family Medicine

## 2015-03-14 VITALS — BP 118/76 | HR 79 | Temp 98.1°F | Resp 16 | Ht 61.0 in | Wt 97.0 lb

## 2015-03-14 DIAGNOSIS — R05 Cough: Secondary | ICD-10-CM | POA: Diagnosis not present

## 2015-03-14 DIAGNOSIS — H9202 Otalgia, left ear: Secondary | ICD-10-CM

## 2015-03-14 DIAGNOSIS — F4322 Adjustment disorder with anxiety: Secondary | ICD-10-CM

## 2015-03-14 DIAGNOSIS — R059 Cough, unspecified: Secondary | ICD-10-CM

## 2015-03-14 MED ORDER — BENZONATATE 100 MG PO CAPS: 100.0000 mg | ORAL_CAPSULE | Freq: Three times a day (TID) | ORAL | Status: DC | PRN

## 2015-03-14 MED ORDER — SERTRALINE HCL 50 MG PO TABS: 50.0000 mg | ORAL_TABLET | Freq: Every day | ORAL | Status: DC

## 2015-03-14 NOTE — Patient Instructions (Signed)
Take benzonatate cough pills one or 2 pills 3 times daily as needed for the dry cough  If the cough is not improving over the next week I will need to try some other medication on you  Take the sertraline 50 mg one pill each morning for being sad and anxious. It will require taking it every day for about 10 days before you say that you are starting to feel better and not as sad.  Continue to use the lorazepam 1 pill once or twice daily when needed for more nervousness and anxiousness  Plan to return for a recheck in 2 weeks, sooner if worse
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
## 2015-03-22 ENCOUNTER — Ambulatory Visit (INDEPENDENT_AMBULATORY_CARE_PROVIDER_SITE_OTHER): Payer: BLUE CROSS/BLUE SHIELD | Admitting: Family Medicine

## 2015-03-22 ENCOUNTER — Ambulatory Visit (INDEPENDENT_AMBULATORY_CARE_PROVIDER_SITE_OTHER): Payer: BLUE CROSS/BLUE SHIELD

## 2015-03-22 VITALS — BP 110/64 | HR 66 | Temp 97.8°F | Resp 14 | Ht 61.5 in | Wt 97.2 lb

## 2015-03-22 DIAGNOSIS — F4322 Adjustment disorder with anxiety: Secondary | ICD-10-CM

## 2015-03-22 DIAGNOSIS — F4310 Post-traumatic stress disorder, unspecified: Secondary | ICD-10-CM | POA: Diagnosis not present

## 2015-03-22 DIAGNOSIS — R05 Cough: Secondary | ICD-10-CM

## 2015-03-22 DIAGNOSIS — F4323 Adjustment disorder with mixed anxiety and depressed mood: Secondary | ICD-10-CM | POA: Diagnosis not present

## 2015-03-22 DIAGNOSIS — R059 Cough, unspecified: Secondary | ICD-10-CM

## 2015-03-22 DIAGNOSIS — J4520 Mild intermittent asthma, uncomplicated: Secondary | ICD-10-CM

## 2015-03-22 DIAGNOSIS — J683 Other acute and subacute respiratory conditions due to chemicals, gases, fumes and vapors: Secondary | ICD-10-CM

## 2015-03-22 DIAGNOSIS — R053 Chronic cough: Secondary | ICD-10-CM

## 2015-03-22 HISTORY — DX: Adjustment disorder with mixed anxiety and depressed mood: F43.23

## 2015-03-22 MED ORDER — LORAZEPAM 0.5 MG PO TABS: ORAL_TABLET | ORAL | Status: DC

## 2015-03-22 MED ORDER — BENZONATATE 100 MG PO CAPS: 100.0000 mg | ORAL_CAPSULE | Freq: Three times a day (TID) | ORAL | Status: DC | PRN

## 2015-03-22 MED ORDER — ALBUTEROL SULFATE HFA 108 (90 BASE) MCG/ACT IN AERS: INHALATION_SPRAY | RESPIRATORY_TRACT | Status: DC

## 2015-03-22 MED ORDER — SERTRALINE HCL 50 MG PO TABS: ORAL_TABLET | ORAL | Status: DC

## 2015-03-22 MED ORDER — PREDNISONE 20 MG PO TABS: ORAL_TABLET | ORAL | Status: DC
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
## 2015-04-15 ENCOUNTER — Ambulatory Visit (INDEPENDENT_AMBULATORY_CARE_PROVIDER_SITE_OTHER): Payer: BLUE CROSS/BLUE SHIELD | Admitting: Family Medicine

## 2015-04-15 ENCOUNTER — Other Ambulatory Visit: Payer: Self-pay

## 2015-04-15 VITALS — BP 114/64 | HR 71 | Temp 98.0°F | Resp 16 | Ht 61.5 in | Wt 95.6 lb

## 2015-04-15 DIAGNOSIS — Z1231 Encounter for screening mammogram for malignant neoplasm of breast: Secondary | ICD-10-CM

## 2015-04-15 DIAGNOSIS — Z23 Encounter for immunization: Secondary | ICD-10-CM | POA: Diagnosis not present

## 2015-04-15 DIAGNOSIS — Z0000 Encounter for general adult medical examination without abnormal findings: Secondary | ICD-10-CM

## 2015-04-15 LAB — CBC
HCT: 42.4 % (ref 36.0–46.0)
Hemoglobin: 14.6 g/dL (ref 12.0–15.0)
MCH: 31 pg (ref 26.0–34.0)
MCHC: 34.4 g/dL (ref 30.0–36.0)
MCV: 90 fL (ref 78.0–100.0)
MPV: 9.8 fL (ref 8.6–12.4)
PLATELETS: 341 10*3/uL (ref 150–400)
RBC: 4.71 MIL/uL (ref 3.87–5.11)
RDW: 12.6 % (ref 11.5–15.5)
WBC: 7.3 10*3/uL (ref 4.0–10.5)

## 2015-04-15 LAB — COMPREHENSIVE METABOLIC PANEL
ALT: 13 U/L (ref 6–29)
AST: 19 U/L (ref 10–35)
Albumin: 4.5 g/dL (ref 3.6–5.1)
Alkaline Phosphatase: 51 U/L (ref 33–115)
BUN: 11 mg/dL (ref 7–25)
CALCIUM: 9.7 mg/dL (ref 8.6–10.2)
CO2: 25 mmol/L (ref 20–31)
Chloride: 105 mmol/L (ref 98–110)
Creat: 0.59 mg/dL (ref 0.50–1.10)
GLUCOSE: 90 mg/dL (ref 65–99)
POTASSIUM: 4.1 mmol/L (ref 3.5–5.3)
Sodium: 140 mmol/L (ref 135–146)
Total Bilirubin: 1.2 mg/dL (ref 0.2–1.2)
Total Protein: 7.2 g/dL (ref 6.1–8.1)
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
## 2015-04-15 NOTE — Patient Instructions (Signed)
Continue current medications  Plan to return for a follow-up of your anxiety and depression in about early December, sooner if problems arise  Continue to try and get regular exercise  I'll let you know the results of your labs in a few days  Someone will contact you with the referral for the mammogram

## 2015-04-16 LAB — HEMOGLOBIN A1C
HEMOGLOBIN A1C: 5.2 % (ref <5.7)
Mean Plasma Glucose: 103 mg/dL (ref <117)

## 2015-04-25 ENCOUNTER — Ambulatory Visit (INDEPENDENT_AMBULATORY_CARE_PROVIDER_SITE_OTHER): Payer: BLUE CROSS/BLUE SHIELD | Admitting: Family Medicine

## 2015-04-25 ENCOUNTER — Encounter: Payer: Self-pay | Admitting: Family Medicine

## 2015-04-25 VITALS — BP 103/67 | HR 78 | Temp 97.9°F | Resp 16 | Wt 96.0 lb

## 2015-04-25 DIAGNOSIS — J209 Acute bronchitis, unspecified: Secondary | ICD-10-CM | POA: Diagnosis not present

## 2015-04-25 MED ORDER — CETIRIZINE HCL 10 MG PO TABS: 10.0000 mg | ORAL_TABLET | Freq: Every day | ORAL | Status: DC

## 2015-04-25 MED ORDER — MOMETASONE FUROATE 50 MCG/ACT NA SUSP: 2.0000 | Freq: Every day | NASAL | Status: DC

## 2015-04-25 MED ORDER — AZITHROMYCIN 250 MG PO TABS: ORAL_TABLET | ORAL | Status: DC

## 2015-04-25 NOTE — Patient Instructions (Signed)
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
## 2015-04-29 ENCOUNTER — Ambulatory Visit
Admission: RE | Admit: 2015-04-29 | Discharge: 2015-04-29 | Disposition: A | Discharge status: Home / Self Care | Payer: BLUE CROSS/BLUE SHIELD | Source: Ambulatory Visit | Attending: Family Medicine | Admitting: Family Medicine

## 2015-04-29 DIAGNOSIS — Z1231 Encounter for screening mammogram for malignant neoplasm of breast: Secondary | ICD-10-CM

## 2015-07-15 ENCOUNTER — Ambulatory Visit (INDEPENDENT_AMBULATORY_CARE_PROVIDER_SITE_OTHER): Payer: BLUE CROSS/BLUE SHIELD | Admitting: Family Medicine

## 2015-07-15 VITALS — BP 109/70 | HR 75 | Temp 98.2°F | Resp 16 | Ht 61.0 in | Wt 103.0 lb

## 2015-07-15 DIAGNOSIS — F4310 Post-traumatic stress disorder, unspecified: Secondary | ICD-10-CM | POA: Diagnosis not present

## 2015-07-15 DIAGNOSIS — F329 Major depressive disorder, single episode, unspecified: Secondary | ICD-10-CM | POA: Diagnosis not present

## 2015-07-15 DIAGNOSIS — G479 Sleep disorder, unspecified: Secondary | ICD-10-CM

## 2015-07-15 DIAGNOSIS — F4323 Adjustment disorder with mixed anxiety and depressed mood: Secondary | ICD-10-CM

## 2015-07-15 DIAGNOSIS — F32A Depression, unspecified: Secondary | ICD-10-CM

## 2015-07-15 MED ORDER — BUPROPION HCL ER (XL) 150 MG PO TB24: 150.0000 mg | ORAL_TABLET | Freq: Every day | ORAL | Status: DC

## 2015-07-15 MED ORDER — LORAZEPAM 0.5 MG PO TABS: ORAL_TABLET | ORAL | Status: DC

## 2015-07-15 NOTE — Patient Instructions (Signed)
Take 1 lorazepam 1 mg at bedtime if needed for sleep. Generally I would not recommend taking this in the daytime now because it does make you more drowsy. However if you aren't extremely anxious you can take one half of a pill in the daytime.  Take the Wellbutrin XL (bupropion) 150 mg 1 each morning. It will take using this daily for about 2 weeks before you say that you depression seems to be doing a lot better.  Continue seeing Mariane MastersHelen Campbell for counseling([phone] extension [phone]).  I left her a message about the medication we are using.  Return at anytime if needed  Go to the emergency room if you are having suicidal problems. You can always call your emergency help line.  Continue with your support group.  Plan to return to see me in about 3 weeks. If I am unavailable see Dr. Conley RollsLe or Dr. Clelia CroftShaw both of whom have seen you in the past.

## 2015-07-15 NOTE — Progress Notes (Signed)
Patient ID: Terri Wall, female    DOB: 1965-10-15  Age: 50 y.o. MRN: [id number]  Chief Complaint  Patient presents with  . Follow-up    bronchitis from last visit    Subjective:   Patient is here for a follow-up. She continues to have trouble with her depression. The holiday season was difficult for her. She still has a hard time riding in a car because of the memories it flashes back. She was not able to tolerate the sertraline well. Today she went to [facility] Campbell's number is [phone] extension 6. She sent me a note which I have scanned into the record. She was wanting her placed back on an antidepressant, trying to find one that she can tolerate well. His  The patient did have one time of extreme depression that she felt suicidal, went to the kitchen and got a knife. She then called the hotline talk her through things, got her to go back to bed and breathe deeply and she relaxed and went on to sleep. She has not had any more suicidal thoughts. She knows she does not want to do herself in, and she has the hotline available if she needs it. She also has a support group and they have been very helpful to her.  Current allergies, medications, problem list, past/family and social histories reviewed.  Objective:  BP 109/70 mmHg  Pulse 75  Temp(Src) 98.2 F (36.8 C)  Resp 16  Ht 5\' 1"  (1.549 m)  Wt 103 lb (46.72 kg)  BMI 19.47 kg/m2  SpO2 99%  LMP 06/02/2015 (Approximate)  Chest clear, heart regular without ectopy or murmur    Assessment & Plan:   Assessment: 1. Depression   2. Sleep disturbance   3. Adjustment disorder with mixed anxiety and depressed mood   4. Post-traumatic stress       Plan: Will treat with Wellbutrin to see if she tolerates that better than she did an SSRI.      Patient Instructions  Take 1 lorazepam 1 mg at bedtime if needed for sleep. Generally I would not recommend  taking this in the daytime now because it does make you more drowsy. However if you aren't extremely anxious you can take one half of a pill in the daytime.  Take the Wellbutrin XL (bupropion) 150 mg 1 each morning. It will take using this daily for about 2 weeks before you say that you depression seems to be doing a lot better.  Continue seeing Mariane MastersHelen Campbell for counseling([phone] extension 6).  I left her a message about the medication we are using.  Return at anytime if needed  Go to the emergency room if you are having suicidal problems. You can always call your emergency help line.  Continue with your support group.  Plan to return to see me in about 3 weeks. If I am unavailable see Dr. Conley RollsLe or Dr. Clelia CroftShaw both of whom have seen you in the past.   Greater than 20 minutes of the 30 minute visit were spent in counseling.   Return in about 3 weeks (around 08/05/2015).   Zafirah Vanzee, MD 07/15/2015

## 2015-07-30 ENCOUNTER — Ambulatory Visit (INDEPENDENT_AMBULATORY_CARE_PROVIDER_SITE_OTHER): Payer: BLUE CROSS/BLUE SHIELD | Admitting: Family Medicine

## 2015-07-30 VITALS — BP 110/70 | HR 87 | Temp 97.7°F | Resp 18 | Ht 62.0 in | Wt 101.6 lb

## 2015-07-30 DIAGNOSIS — G479 Sleep disorder, unspecified: Secondary | ICD-10-CM | POA: Diagnosis not present

## 2015-07-30 DIAGNOSIS — F411 Generalized anxiety disorder: Secondary | ICD-10-CM

## 2015-07-30 DIAGNOSIS — F329 Major depressive disorder, single episode, unspecified: Secondary | ICD-10-CM | POA: Diagnosis not present

## 2015-07-30 DIAGNOSIS — F32A Depression, unspecified: Secondary | ICD-10-CM

## 2015-07-30 MED ORDER — ALPRAZOLAM 0.25 MG PO TABS: ORAL_TABLET | ORAL | Status: DC

## 2015-07-30 MED ORDER — MIRTAZAPINE 15 MG PO TABS: 15.0000 mg | ORAL_TABLET | Freq: Every day | ORAL | Status: DC
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
## 2015-08-16 ENCOUNTER — Ambulatory Visit (INDEPENDENT_AMBULATORY_CARE_PROVIDER_SITE_OTHER): Payer: BLUE CROSS/BLUE SHIELD | Admitting: Family Medicine

## 2015-08-16 VITALS — BP 110/70 | HR 88 | Temp 98.1°F | Resp 18 | Ht 62.0 in | Wt 105.0 lb

## 2015-08-16 DIAGNOSIS — R05 Cough: Secondary | ICD-10-CM

## 2015-08-16 DIAGNOSIS — Z3009 Encounter for other general counseling and advice on contraception: Secondary | ICD-10-CM

## 2015-08-16 DIAGNOSIS — R059 Cough, unspecified: Secondary | ICD-10-CM

## 2015-08-16 DIAGNOSIS — N926 Irregular menstruation, unspecified: Secondary | ICD-10-CM | POA: Diagnosis not present

## 2015-08-16 DIAGNOSIS — R0602 Shortness of breath: Secondary | ICD-10-CM | POA: Diagnosis not present

## 2015-08-16 DIAGNOSIS — R058 Other specified cough: Secondary | ICD-10-CM

## 2015-08-16 MED ORDER — NORETHIN-ETH ESTRAD-FE BIPHAS 1 MG-10 MCG / 10 MCG PO TABS: 1.0000 | ORAL_TABLET | Freq: Every day | ORAL | Status: DC

## 2015-08-16 MED ORDER — BENZONATATE 200 MG PO CAPS: 100.0000 mg | ORAL_CAPSULE | Freq: Three times a day (TID) | ORAL | Status: DC | PRN

## 2015-08-16 MED ORDER — CETIRIZINE HCL 10 MG PO TABS: 10.0000 mg | ORAL_TABLET | Freq: Every day | ORAL | Status: DC

## 2015-08-16 MED ORDER — ALBUTEROL SULFATE (2.5 MG/3ML) 0.083% IN NEBU
2.5000 mg | INHALATION_SOLUTION | Freq: Once | RESPIRATORY_TRACT | Status: AC
  Administered 2015-08-16: 2.5 mg via RESPIRATORY_TRACT

## 2015-08-16 MED ORDER — OMEPRAZOLE 40 MG PO CPDR: 40.0000 mg | DELAYED_RELEASE_CAPSULE | Freq: Every day | ORAL | Status: DC

## 2015-08-16 NOTE — Progress Notes (Addendum)
Subjective:    Patient ID: Terri Wall, female    DOB: 04/11/1966, 50 y.o.   MRN: [id number] By signing my name below, I, Javier Docker, attest that this documentation has been prepared under the direction and in the presence of Norberto Sorenson, MD. Electronically Signed: Javier Docker, ER Scribe. 08/16/2015. 2:40 PM.  Chief Complaint  Patient presents with  . Cough    for several days   HPI HPI Comments: Terri Wall is a 50 y.o. female who presents to [facility] complaining of a mild cough for the last four days. She has a past hx of GERD, but she is not currently taking any GERD medications. She states her throat is very dry in the morning. She is also complaining of difficulty with deep breathing, and states the sensation feels the same as when she had bronchitis last October.   The medicine Dr. Conley Rolls gave her for sleep (xanax and Remeron) is working well. She was previously put on Wellbutrin and lorazepam which caused severe insomnia, in addition to giving her heart palpitations and making her feel like she was going to die. Her husband is in the army and about to return home and she would like to go on birth control to avoid any unplanned pregnancies. She is still menstruating though her periods are irregular.   Pt did see Dr. Elnoria Howard in the past and the last note was June, 2016. She was put on PRN zantac at that time.  Of note, she was supposed to have her colonoscopy repeated in three years. She had a negative pap smear and negative high risk HPV 12/30/2013.  Past Medical History  Diagnosis Date  . Allergy   . Reflux   . History of positive PPD, treatment status unknown    Allergies  Allergen Reactions  . Doxycycline Hives  . Tetracyclines & Related Hives   Current Outpatient Prescriptions on File Prior to Visit  Medication Sig Dispense Refill  . ALPRAZolam (XANAX) 0.25 MG tablet 1/2 tab to 1 tab po night for insomnia prn 30 tablet 0  . mirtazapine (REMERON) 15 MG tablet Take 1 tablet (15 mg  total) by mouth at bedtime. 30 tablet 5  . lactose free nutrition (BOOST) LIQD Take 237 mLs by mouth once. Reported on 07/30/2015    . Multiple Vitamins-Minerals (MULTIVITAMIN WITH MINERALS) tablet Take 1 tablet by mouth daily.     No current facility-administered medications on file prior to visit.    Review of Systems  Constitutional: Negative for fever and chills.  HENT: Positive for congestion and postnasal drip.   Respiratory: Positive for cough and shortness of breath.   Cardiovascular: Negative for palpitations.  Psychiatric/Behavioral: Negative for suicidal ideas and sleep disturbance.      Objective:  BP 110/70 mmHg  Pulse 113  Temp(Src) 98.1 F (36.7 C)  Resp 18  Ht  (1.575 m)  Wt 105 lb (47.628 kg)  BMI 19.20 kg/m2  SpO2 99%  Physical Exam  Constitutional: She is oriented to person, place, and time. She appears well-developed and well-nourished.  HENT:  Head: Normocephalic and atraumatic.  Left TM with mid ear effusion. Nares normal. Oropharynx with some erythema and postnasal drip.   Eyes: EOM are normal. Pupils are equal, round, and reactive to light.  Neck: Neck supple.  Normal thyroid.  Cardiovascular: Normal rate, regular rhythm and normal heart sounds.   No murmur heard. Pulmonary/Chest: Effort normal and breath sounds normal.  Abdominal: Soft.  Musculoskeletal: Normal range  of motion.  Neurological: She is alert and oriented to person, place, and time.  Skin: Skin is warm and dry.  Psychiatric: She has a normal mood and affect. Her behavior is normal.   Predicted peak flow is 460, actual is 320. After albuterol she went up to 370.     Assessment & Plan:   1. Cough   2. Irregular menses   3. Upper airway cough syndrome   4.    Family planning - newly married and husband coming home soon (deployed?), pt still with reg menses and no real perimenopausal sxs so rec starting OCPS.   Meds ordered this encounter  Medications  .  Norethindrone-Ethinyl Estradiol-Fe Biphas (LO LOESTRIN FE) 1 MG-10 MCG / 10 MCG tablet    Sig: Take 1 tablet by mouth daily.    Dispense:  1 Package    Refill:  11  . benzonatate (TESSALON) 200 MG capsule    Sig: Take 1 capsule (200 mg total) by mouth 3 (three) times daily as needed for cough.    Dispense:  60 capsule    Refill:  0  . omeprazole (PRILOSEC) 40 MG capsule    Sig: Take 1 capsule (40 mg total) by mouth daily. 30 minutes before supper    Dispense:  30 capsule    Refill:  1  . albuterol (PROVENTIL) (2.5 MG/3ML) 0.083% nebulizer solution 2.5 mg    Sig:   . cetirizine (ZYRTEC) 10 MG tablet    Sig: Take 1 tablet (10 mg total) by mouth at bedtime.    Dispense:  30 tablet    Refill:  11   Language level caveat.  Over 40 min spent in face-to-face evaluation of and consultation with patient and coordination of care.  Over 50% of this time was spent counseling this patient.  I personally performed the services described in this documentation, which was scribed in my presence. The recorded information has been reviewed and considered, and addended by me as needed.  Norberto Sorenson, MD MPH

## 2015-08-16 NOTE — Patient Instructions (Addendum)
The most common causes of chronic cough in immunocompetent adults include the following: upper airway cough syndrome (UACS), previously referred to as postnasal drip syndrome (PNDS), which is caused by variety of rhinosinus conditions.  Most likely this is Classic Upper airway cough syndrome, so named because it's frequently impossible to sort out how much is chronic rhinitis/sinusitis with freq throat clearing.  Try at night allergy medications since this is when cough is the worst. Suppress your cough and throat clearing with candy and delsym and tessalon pearles, not throat lozenges.  Cough, Adult Coughing is a reflex that clears your throat and your airways. Coughing helps to heal and protect your lungs. It is normal to cough occasionally, but a cough that happens with other symptoms or lasts a long time may be a sign of a condition that needs treatment. A cough may last only 2-3 weeks (acute), or it may last longer than 8 weeks (chronic). CAUSES Coughing is commonly caused by:  Breathing in substances that irritate your lungs.  A viral or bacterial respiratory infection.  Allergies.  Asthma.  Postnasal drip.  Smoking.  Acid backing up from the stomach into the esophagus (gastroesophageal reflux).  Certain medicines.  Chronic lung problems, including COPD (or rarely, lung cancer).  Other medical conditions such as heart failure. HOME CARE INSTRUCTIONS  Pay attention to any changes in your symptoms. Take these actions to help with your discomfort:  Take medicines only as told by your health care provider.  If you were prescribed an antibiotic medicine, take it as told by your health care provider. Do not stop taking the antibiotic even if you start to feel better.  Talk with your health care provider before you take a cough suppressant medicine.  Drink enough fluid to keep your urine clear or pale yellow.  If the air is dry, use a cold steam vaporizer or humidifier in your  bedroom or your home to help loosen secretions.  Avoid anything that causes you to cough at work or at home.  If your cough is worse at night, try sleeping in a semi-upright position.  Avoid cigarette smoke. If you smoke, quit smoking. If you need help quitting, ask your health care provider.  Avoid caffeine.  Avoid alcohol.  Rest as needed. SEEK MEDICAL CARE IF:   You have new symptoms.  You cough up pus.  Your cough does not get better after 2-3 weeks, or your cough gets worse.  You cannot control your cough with suppressant medicines and you are losing sleep.  You develop pain that is getting worse or pain that is not controlled with pain medicines.  You have a fever.  You have unexplained weight loss.  You have night sweats. SEEK IMMEDIATE MEDICAL CARE IF:  You cough up blood.  You have difficulty breathing.  Your heartbeat is very fast.   This information is not intended to replace advice given to you by your health care provider. Make sure you discuss any questions you have with your health care provider.   Document Released: 12/26/2010 Document Revised: 03/20/2015 Document Reviewed: 09/05/2014 Elsevier Interactive Patient Education [employer]

## 2015-08-21 ENCOUNTER — Ambulatory Visit (INDEPENDENT_AMBULATORY_CARE_PROVIDER_SITE_OTHER): Payer: BLUE CROSS/BLUE SHIELD | Admitting: Family Medicine

## 2015-08-21 VITALS — BP 122/72 | HR 80 | Temp 98.7°F | Resp 20 | Ht 62.0 in | Wt 106.0 lb

## 2015-08-21 DIAGNOSIS — J209 Acute bronchitis, unspecified: Secondary | ICD-10-CM

## 2015-08-21 MED ORDER — AZITHROMYCIN 250 MG PO TABS: ORAL_TABLET | ORAL | Status: DC

## 2015-08-21 NOTE — Patient Instructions (Signed)
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
## 2015-09-30 ENCOUNTER — Ambulatory Visit (INDEPENDENT_AMBULATORY_CARE_PROVIDER_SITE_OTHER): Payer: BLUE CROSS/BLUE SHIELD | Admitting: Family Medicine

## 2015-09-30 VITALS — BP 118/68 | HR 83 | Temp 98.2°F | Resp 16 | Ht 61.0 in | Wt 111.0 lb

## 2015-09-30 DIAGNOSIS — F32A Depression, unspecified: Secondary | ICD-10-CM

## 2015-09-30 DIAGNOSIS — F4310 Post-traumatic stress disorder, unspecified: Secondary | ICD-10-CM

## 2015-09-30 DIAGNOSIS — F329 Major depressive disorder, single episode, unspecified: Secondary | ICD-10-CM

## 2015-09-30 DIAGNOSIS — F4323 Adjustment disorder with mixed anxiety and depressed mood: Secondary | ICD-10-CM

## 2015-09-30 MED ORDER — MIRTAZAPINE 15 MG PO TABS: ORAL_TABLET | ORAL | Status: DC

## 2015-09-30 NOTE — Progress Notes (Deleted)
   Subjective:    Patient ID: Terri Wall, female    DOB: [birth year], 50 y.o.   MRN: [id number]  HPI    Review of Systems     Objective:   Physical Exam        Assessment & Plan:

## 2015-09-30 NOTE — Patient Instructions (Addendum)
Try increasing the Remeron to 30 mg at bedtime. Take 2 of the 15 mg pills. Wait until the weekend to try this, and rather than waiting actually till bedtime take it after you get home when you eat your supper or right after supper.  Continue to try and get regular exercise  Referral is being made to psychiatry, and our staff will work on that and contact you as soon as this can be arranged. If you do not hear from us over the next by Friday or Monday regarding this, call and speak to the referral's desk and see if they have heard anything from the psychiatrist.  If you have not yet seen the psychiatrist, plan to return in about 3 weeks. If you have seen the psychiatrist they should manage things at that time.  In the event of acutely worsening go to the emergency room.  IF you received an x-ray today, you will [facility]. Please contact [facility] at [phone] with questions or concerns regarding your invoice.   IF you received labwork today, you will receive an invoice from [facility]. Please contact [facility] at [phone] with questions or concerns regarding your invoice.   Our billing staff will not be able to assist you with questions regarding bills from these companies.  You will be contacted with the lab results as soon as they are available. The fastest way to get your results is to activate your My Chart account. Instructions are located on the last page of this paperwork. If you have not heard from us regarding the results in 2 weeks, please contact this office.
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
## 2015-11-04 DIAGNOSIS — F4310 Post-traumatic stress disorder, unspecified: Secondary | ICD-10-CM | POA: Diagnosis not present

## 2015-12-24 ENCOUNTER — Other Ambulatory Visit: Payer: Self-pay | Admitting: Family Medicine

## 2016-01-13 DIAGNOSIS — F4310 Post-traumatic stress disorder, unspecified: Secondary | ICD-10-CM | POA: Diagnosis not present

## 2016-02-17 NOTE — Progress Notes (Signed)
Never showed last year for MRI

## 2016-02-24 DIAGNOSIS — F4310 Post-traumatic stress disorder, unspecified: Secondary | ICD-10-CM | POA: Diagnosis not present

## 2016-03-06 ENCOUNTER — Ambulatory Visit (INDEPENDENT_AMBULATORY_CARE_PROVIDER_SITE_OTHER): Payer: BLUE CROSS/BLUE SHIELD | Admitting: Family Medicine

## 2016-03-06 VITALS — BP 98/72 | HR 105 | Resp 16 | Ht 61.5 in | Wt 109.8 lb

## 2016-03-06 DIAGNOSIS — Z3009 Encounter for other general counseling and advice on contraception: Secondary | ICD-10-CM | POA: Diagnosis not present

## 2016-03-06 DIAGNOSIS — J0120 Acute ethmoidal sinusitis, unspecified: Secondary | ICD-10-CM | POA: Diagnosis not present

## 2016-03-06 DIAGNOSIS — N926 Irregular menstruation, unspecified: Secondary | ICD-10-CM

## 2016-03-06 LAB — POCT CBC
GRANULOCYTE PERCENT: 69.1 % (ref 37–80)
HEMATOCRIT: 40.3 % (ref 37.7–47.9)
Hemoglobin: 14.3 g/dL (ref 12.2–16.2)
Lymph, poc: 2.1 (ref 0.6–3.4)
MCH, POC: 30.9 pg (ref 27–31.2)
MCHC: 35.4 g/dL (ref 31.8–35.4)
MCV: 87.3 fL (ref 80–97)
MID (CBC): 0.6 (ref 0–0.9)
MPV: 7.1 fL (ref 0–99.8)
PLATELET COUNT, POC: 324 10*3/uL (ref 142–424)
POC Granulocyte: 6 (ref 2–6.9)
POC LYMPH %: 24.2 % (ref 10–50)
POC MID %: 6.7 %M (ref 0–12)
RBC: 4.62 M/uL (ref 4.04–5.48)
RDW, POC: 12.6 %
WBC: 8.7 10*3/uL (ref 4.6–10.2)

## 2016-03-06 LAB — TSH: TSH: 0.8 m[IU]/L

## 2016-03-06 MED ORDER — BENZONATATE 200 MG PO CAPS: 200.0000 mg | ORAL_CAPSULE | Freq: Three times a day (TID) | ORAL | 0 refills | Status: DC | PRN

## 2016-03-06 MED ORDER — AZITHROMYCIN 250 MG PO TABS: ORAL_TABLET | ORAL | 0 refills | Status: DC

## 2016-03-06 MED ORDER — FLUTICASONE PROPIONATE 50 MCG/ACT NA SUSP: 2.0000 | Freq: Every day | NASAL | 2 refills | Status: DC

## 2016-03-06 MED ORDER — PSEUDOEPHEDRINE-GUAIFENESIN ER 60-600 MG PO TB12: 1.0000 | ORAL_TABLET | Freq: Two times a day (BID) | ORAL | 0 refills | Status: DC

## 2016-03-06 NOTE — Patient Instructions (Addendum)
IF you received an x-ray today, you will receive an invoice from [facility]. Please contact [facility] at [phone] with questions or concerns regarding your invoice.   IF you received labwork today, you will receive an invoice from [employer]. Please contact [facility] at [phone] with questions or concerns regarding your invoice.   Our billing staff will not be able to assist you with questions regarding bills from these companies.  You will be contacted with the lab results as soon as they are available. The fastest way to get your results is to activate your My Chart account. Instructions are located on the last page of this paperwork. If you have not heard from [place] regarding the results in 2 weeks, please contact this office.    Upper Respiratory Infection, Adult Most upper respiratory infections (URIs) are a viral infection of the air passages leading to the lungs. A URI affects the nose, throat, and upper air passages. The most common type of URI is nasopharyngitis and is typically referred to as "the common cold." URIs run their course and usually go away on their own. Most of the time, a URI does not require medical attention, but sometimes a bacterial infection in the upper airways can follow a viral infection. This is called a secondary infection. Sinus and middle ear infections are common types of secondary upper respiratory infections. Bacterial pneumonia can also complicate a URI. A URI can worsen asthma and chronic obstructive pulmonary disease (COPD). Sometimes, these complications can require emergency medical care and may be life threatening.  CAUSES Almost all URIs are caused by viruses. A virus is a type of germ and can spread from one person to another.  RISKS FACTORS You may be at risk for a URI if:   You smoke.   You have chronic heart or lung disease.  You have a weakened defense (immune) system.   You are  very young or very old.   You have nasal allergies or asthma.  You work in crowded or poorly ventilated areas.  You work in health care facilities or schools. SIGNS AND SYMPTOMS  Symptoms typically develop 2-3 days after you come in contact with a cold virus. Most viral URIs last 7-10 days. However, viral URIs from the influenza virus (flu virus) can last 14-18 days and are typically more severe. Symptoms may include:   Runny or stuffy (congested) nose.   Sneezing.   Cough.   Sore throat.   Headache.   Fatigue.   Fever.   Loss of appetite.   Pain in your forehead, behind your eyes, and over your cheekbones (sinus pain).  Muscle aches.  DIAGNOSIS  Your health care provider may diagnose a URI by:  Physical exam.  Tests to check that your symptoms are not due to another condition such as:  Strep throat.  Sinusitis.  Pneumonia.  Asthma. TREATMENT  A URI goes away on its own with time. It cannot be cured with medicines, but medicines may be prescribed or recommended to relieve symptoms. Medicines may help:  Reduce your fever.  Reduce your cough.  Relieve nasal congestion. HOME CARE INSTRUCTIONS   Take medicines only as directed by your health care provider.   Gargle warm saltwater or take cough drops to comfort your throat as directed by your health care provider.  Use a warm mist humidifier or inhale steam from a shower to increase air moisture. This may make it easier to breathe.  Drink enough  fluid to keep your urine clear or pale yellow.   Eat soups and other clear broths and maintain good nutrition.   Rest as needed.   Return to work when your temperature has returned to normal or as your health care provider advises. You may need to stay home longer to avoid infecting others. You can also use a face mask and careful hand washing to prevent spread of the virus.  Increase the usage of your inhaler if you have asthma.   Do not use any  tobacco products, including cigarettes, chewing tobacco, or electronic cigarettes. If you need help quitting, ask your health care provider. PREVENTION  The best way to protect yourself from getting a cold is to practice good hygiene.   Avoid oral or hand contact with people with cold symptoms.   Wash your hands often if contact occurs.  There is no clear evidence that vitamin C, vitamin E, echinacea, or exercise reduces the chance of developing a cold. However, it is always recommended to get plenty of rest, exercise, and practice good nutrition.  SEEK MEDICAL CARE IF:   You are getting worse rather than better.   Your symptoms are not controlled by medicine.   You have chills.  You have worsening shortness of breath.  You have brown or red mucus.  You have yellow or brown nasal discharge.  You have pain in your face, especially when you bend forward.  You have a fever.  You have swollen neck glands.  You have pain while swallowing.  You have white areas in the back of your throat. SEEK IMMEDIATE MEDICAL CARE IF:   You have severe or persistent:  Headache.  Ear pain.  Sinus pain.  Chest pain.  You have chronic lung disease and any of the following:  Wheezing.  Prolonged cough.  Coughing up blood.  A change in your usual mucus.  You have a stiff neck.  You have changes in your:  Vision.  Hearing.  Thinking.  Mood. MAKE SURE YOU:   Understand these instructions.  Will watch your condition.  Will get help right away if you are not doing well or get worse.   This information is not intended to replace advice given to you by your health care provider. Make sure you discuss any questions you have with your health care provider.   Document Released: 12/23/2000 Document Revised: 11/13/2014 Document Reviewed: 10/04/2013 Elsevier Interactive Patient Education 2016 [place] is the time when your body begins  to move into the menopause (no menstrual period for 12 straight months). It is a natural process. Perimenopause can begin 2-8 years before the menopause and usually lasts for 1 year after the menopause. During this time, your ovaries may or may not produce an egg. The ovaries vary in their production of estrogen and progesterone hormones each month. This can cause irregular menstrual periods, difficulty getting pregnant, vaginal bleeding between periods, and uncomfortable symptoms. CAUSES  Irregular production of the ovarian hormones, estrogen and progesterone, and not ovulating every month.  Other causes include:  Tumor of the pituitary gland in the brain.  Medical disease that affects the ovaries.  Radiation treatment.  Chemotherapy.  Unknown causes.  Heavy smoking and excessive alcohol intake can bring on perimenopause sooner. SIGNS AND SYMPTOMS   Hot flashes.  Night sweats.  Irregular menstrual periods.  Decreased sex drive.  Vaginal dryness.  Headaches.  Mood swings.  Depression.  Memory problems.  Irritability.  Tiredness.  Weight gain.  Trouble getting pregnant.  The beginning of losing bone cells (osteoporosis).  The beginning of hardening of the arteries (atherosclerosis). DIAGNOSIS  Your health care provider will make a diagnosis by analyzing your age, menstrual history, and symptoms. He or she will do a physical exam and note any changes in your body, especially your female organs. Female hormone tests may or may not be helpful depending on the amount of female hormones you produce and when you produce them. However, other hormone tests may be helpful to rule out other problems. TREATMENT  In some cases, no treatment is needed. The decision on whether treatment is necessary during the perimenopause should be made by you and your health care provider based on how the symptoms are affecting you and your lifestyle. Various treatments are available, such  as:  Treating individual symptoms with a specific medicine for that symptom.  Herbal medicines that can help specific symptoms.  Counseling.  Group therapy. HOME CARE INSTRUCTIONS   Keep track of your menstrual periods (when they occur, how heavy they are, how long between periods, and how long they last) as well as your symptoms and when they started.  Only take over-the-counter or prescription medicines as directed by your health care provider.  Sleep and rest.  Exercise.  Eat a diet that contains calcium (good for your bones) and soy (acts like the estrogen hormone).  Do not smoke.  Avoid alcoholic beverages.  Take vitamin supplements as recommended by your health care provider. Taking vitamin E may help in certain cases.  Take calcium and vitamin D supplements to help prevent bone loss.  Group therapy is sometimes helpful.  Acupuncture may help in some cases. SEEK MEDICAL CARE IF:   You have questions about any symptoms you are having.  You need a referral to a specialist (gynecologist, psychiatrist, or psychologist). SEEK IMMEDIATE MEDICAL CARE IF:   You have vaginal bleeding.  Your period lasts longer than 8 days.  Your periods are recurring sooner than 21 days.  You have bleeding after intercourse.  You have severe depression.  You have pain when you urinate.  You have severe headaches.  You have vision problems.   This information is not intended to replace advice given to you by your health care provider. Make sure you discuss any questions you have with your health care provider.   Document Released: 08/06/2004 Document Revised: 07/20/2014 Document Reviewed: 01/26/2013 Elsevier Interactive Patient Education [employer]. Menopause is a normal process in which your reproductive ability comes to an end. This process happens gradually over a span of months to years, usually between the ages of 10 and 35. Menopause is complete when you have  missed 12 consecutive menstrual periods. It is important to talk with your health care provider about some of the most common conditions that affect postmenopausal women, such as heart disease, cancer, and bone loss (osteoporosis). Adopting a healthy lifestyle and getting preventive care can help to promote your health and wellness. Those actions can also lower your chances of developing some of these common conditions. WHAT SHOULD I KNOW ABOUT MENOPAUSE? During menopause, you may experience a number of symptoms, such as:  Moderate-to-severe hot flashes.  Night sweats.  Decrease in sex drive.  Mood swings.  Headaches.  Tiredness.  Irritability.  Memory problems.  Insomnia. Choosing to treat or not to treat menopausal changes is an individual decision that you make with your health care provider. WHAT SHOULD I KNOW ABOUT HORMONE REPLACEMENT  THERAPY AND SUPPLEMENTS? Hormone therapy products are effective for treating symptoms that are associated with menopause, such as hot flashes and night sweats. Hormone replacement carries certain risks, especially as you become older. If you are thinking about using estrogen or estrogen with progestin treatments, discuss the benefits and risks with your health care provider. WHAT SHOULD I KNOW ABOUT HEART DISEASE AND STROKE? Heart disease, heart attack, and stroke become more likely as you age. This may be due, in part, to the hormonal changes that your body experiences during menopause. These can affect how your body processes dietary fats, triglycerides, and cholesterol. Heart attack and stroke are both medical emergencies. There are many things that you can do to help prevent heart disease and stroke:  Have your blood pressure checked at least every 1-2 years. High blood pressure causes heart disease and increases the risk of stroke.  If you are 73-57 years old, ask your health care provider if you should take aspirin to prevent a heart attack or  a stroke.  Do not use any tobacco products, including cigarettes, chewing tobacco, or electronic cigarettes. If you need help quitting, ask your health care provider.  It is important to eat a healthy diet and maintain a healthy weight.  Be sure to include plenty of vegetables, fruits, low-fat dairy products, and lean protein.  Avoid eating foods that are high in solid fats, added sugars, or salt (sodium).  Get regular exercise. This is one of the most important things that you can do for your health.  Try to exercise for at least 150 minutes each week. The type of exercise that you do should increase your heart rate and make you sweat. This is known as moderate-intensity exercise.  Try to do strengthening exercises at least twice each week. Do these in addition to the moderate-intensity exercise.  Know your numbers.Ask your health care provider to check your cholesterol and your blood glucose. Continue to have your blood tested as directed by your health care provider. WHAT SHOULD I KNOW ABOUT CANCER SCREENING? There are several types of cancer. Take the following steps to reduce your risk and to catch any cancer development as early as possible. Breast Cancer  Practice breast self-awareness.  This means understanding how your breasts normally appear and feel.  It also means doing regular breast self-exams. Let your health care provider know about any changes, no matter how small.  If you are 32 or older, have a clinician do a breast exam (clinical breast exam or CBE) every year. Depending on your age, family history, and medical history, it may be recommended that you also have a yearly breast X-ray (mammogram).  If you have a family history of breast cancer, talk with your health care provider about genetic screening.  If you are at high risk for breast cancer, talk with your health care provider about having an MRI and a mammogram every year.  Breast cancer (BRCA) gene test is  recommended for women who have family members with BRCA-related cancers. Results of the assessment will determine the need for genetic counseling and BRCA1 and for BRCA2 testing. BRCA-related cancers include these types:  Breast. This occurs in males or females.  Ovarian.  Tubal. This may also be called fallopian tube cancer.  Cancer of the abdominal or pelvic lining (peritoneal cancer).  Prostate.  Pancreatic. Cervical, Uterine, and Ovarian Cancer Your health care provider may recommend that you be screened regularly for cancer of the pelvic organs. These include your ovaries,  uterus, and vagina. This screening involves a pelvic exam, which includes checking for microscopic changes to the surface of your cervix (Pap test).  For women ages 21-65, health care providers may recommend a pelvic exam and a Pap test every three years. For women ages 24-65, they may recommend the Pap test and pelvic exam, combined with testing for human papilloma virus (HPV), every five years. Some types of HPV increase your risk of cervical cancer. Testing for HPV may also be done on women of any age who have unclear Pap test results.  Other health care providers may not recommend any screening for nonpregnant women who are considered low risk for pelvic cancer and have no symptoms. Ask your health care provider if a screening pelvic exam is right for you.  If you have had past treatment for cervical cancer or a condition that could lead to cancer, you need Pap tests and screening for cancer for at least 20 years after your treatment. If Pap tests have been discontinued for you, your risk factors (such as having a new sexual partner) need to be reassessed to determine if you should start having screenings again. Some women have medical problems that increase the chance of getting cervical cancer. In these cases, your health care provider may recommend that you have screening and Pap tests more often.  If you have a  family history of uterine cancer or ovarian cancer, talk with your health care provider about genetic screening.  If you have vaginal bleeding after reaching menopause, tell your health care provider.  There are currently no reliable tests available to screen for ovarian cancer. Lung Cancer Lung cancer screening is recommended for adults 29-28 years old who are at high risk for lung cancer because of a history of smoking. A yearly low-dose CT scan of the lungs is recommended if you:  Currently smoke.  Have a history of at least 30 pack-years of smoking and you currently smoke or have quit within the past 15 years. A pack-year is smoking an average of one pack of cigarettes per day for one year. Yearly screening should:  Continue until it has been 15 years since you quit.  Stop if you develop a health problem that would prevent you from having lung cancer treatment. Colorectal Cancer  This type of cancer can be detected and can often be prevented.  Routine colorectal cancer screening usually begins at age 26 and continues through age 54.  If you have risk factors for colon cancer, your health care provider may recommend that you be screened at an earlier age.  If you have a family history of colorectal cancer, talk with your health care provider about genetic screening.  Your health care provider may also recommend using home test kits to check for hidden blood in your stool.  A small camera at the end of a tube can be used to examine your colon directly (sigmoidoscopy or colonoscopy). This is done to check for the earliest forms of colorectal cancer.  Direct examination of the colon should be repeated every 5-10 years until age 1. However, if early forms of precancerous polyps or small growths are found or if you have a family history or genetic risk for colorectal cancer, you may need to be screened more often. Skin Cancer  Check your skin from head to toe regularly.  Monitor any  moles. Be sure to tell your health care provider:  About any new moles or changes in moles, especially if there  is a change in a mole's shape or color.  If you have a mole that is larger than the size of a pencil eraser.  If any of your family members has a history of skin cancer, especially at a young age, talk with your health care provider about genetic screening.  Always use sunscreen. Apply sunscreen liberally and repeatedly throughout the day.  Whenever you are outside, protect yourself by wearing long sleeves, pants, a wide-brimmed hat, and sunglasses. WHAT SHOULD I KNOW ABOUT OSTEOPOROSIS? Osteoporosis is a condition in which bone destruction happens more quickly than new bone creation. After menopause, you may be at an increased risk for osteoporosis. To help prevent osteoporosis or the bone fractures that can happen because of osteoporosis, the following is recommended:  If you are 84-20 years old, get at least 1,000 mg of calcium and at least 600 mg of vitamin D per day.  If you are older than age 59 but younger than age 46, get at least 1,200 mg of calcium and at least 600 mg of vitamin D per day.  If you are older than age 12, get at least 1,200 mg of calcium and at least 800 mg of vitamin D per day. Smoking and excessive alcohol intake increase the risk of osteoporosis. Eat foods that are rich in calcium and vitamin D, and do weight-bearing exercises several times each week as directed by your health care provider. WHAT SHOULD I KNOW ABOUT HOW MENOPAUSE AFFECTS [place]? Depression may occur at any age, but it is more common as you become older. Common symptoms of depression include:  Low or sad mood.  Changes in sleep patterns.  Changes in appetite or eating patterns.  Feeling an overall lack of motivation or enjoyment of activities that you previously enjoyed.  Frequent crying spells. Talk with your health care provider if you think that you are experiencing  depression. WHAT SHOULD I KNOW ABOUT IMMUNIZATIONS? It is important that you get and maintain your immunizations. These include:  Tetanus, diphtheria, and pertussis (Tdap) booster vaccine.  Influenza every year before the flu season begins.  Pneumonia vaccine.  Shingles vaccine. Your health care provider may also recommend other immunizations.   This information is not intended to replace advice given to you by your health care provider. Make sure you discuss any questions you have with your health care provider.   Document Released: 08/21/2005 Document Revised: 07/20/2014 Document Reviewed: 03/01/2014 Elsevier Interactive Patient Education [employer].

## 2016-03-07 LAB — FSH/LH
FSH: 6.3 m[IU]/mL
LH: 5.3 m[IU]/mL

## 2016-03-16 NOTE — Progress Notes (Signed)
By signing my name below I, Terri Wall, attest that this documentation has been prepared under the direction and in the presence of Terri SorensonEva Earl Zellmer, MD. Electonically Signed. Terri Wall, Scribe 03/16/2016 at 12:19 PM   Subjective:    Patient ID: Terri Wall, female    DOB: 07-24-1965, 50 y.o.   MRN: [id number]  Chief Complaint  Patient presents with  . Cough    x 1 week  . Nasal Congestion    Cough  Associated symptoms include chest pain (with cough, yesterday only, resolved spontaneously). Pertinent negatives include no chills, fever, headaches or rash.   Terri Wall is a 50 y.o. female who presents to the [facility] complaining of cough for the past week. Pt also reports nasal congestion that started first, then pt started having post-nasal drip and started having cough. Pt denies fever, chills, SOB. Pt had some CP while coughing yesterday only. Pt has tried OTC cold medication with no relief. Cough has been making it difficult to sleep.   Pt also reports that she is having a period every few months. Pt is concerned that she could become pregnant because she is still having periods. Pt was on Lo Loestrin 1-10 6 months ago and discontinued it on her own due to it causing nausea. Pt denies any breast tenderness, mood changes, or hot flashes.   Patient Active Problem List   Diagnosis Date Noted  . Adjustment disorder with mixed anxiety and depressed mood 03/22/2015  . GERD (gastroesophageal reflux disease) 12/06/2013  . Allergy 12/06/2013  . History of positive PPD, treatment status unknown 12/06/2013  . Vitamin D deficiency 09/20/2011    Current Outpatient Prescriptions on File Prior to Visit  Medication Sig Dispense Refill  . ALPRAZolam (XANAX) 0.25 MG tablet 1/2 tab to 1 tab po night for insomnia prn 30 tablet 0  . cetirizine (ZYRTEC) 10 MG tablet Take 1 tablet (10 mg total) by mouth at bedtime. 30 tablet 11  . lactose free nutrition (BOOST) LIQD Take 237 mLs by  mouth once. Reported on 09/30/2015    . Multiple Vitamins-Minerals (MULTIVITAMIN WITH MINERALS) tablet Take 1 tablet by mouth daily. Reported on 09/30/2015     No current facility-administered medications on file prior to visit.     Allergies  Allergen Reactions  . Doxycycline Hives  . Tetracyclines & Related Hives    [facility] 03/06/2016 09/30/2015 08/21/2015 08/16/2015 07/30/2015  Decreased Interest 0 0 0 0 0  Down, Depressed, Hopeless 1 0 0 0 0  PHQ - 2 Score 1 0 0 0 0       Review of Systems  Constitutional: Negative for chills and fever.  HENT: Positive for congestion.   Eyes: Negative for visual disturbance.  Respiratory: Positive for cough.   Cardiovascular: Positive for chest pain (with cough, yesterday only, resolved spontaneously).  Gastrointestinal: Negative for abdominal pain.  Genitourinary: Negative for dysuria.  Musculoskeletal: Negative for back pain.  Skin: Negative for rash.  Neurological: Negative for headaches.  Psychiatric/Behavioral: Negative for agitation.       Objective:   Physical Exam  Constitutional: She is oriented to person, place, and time. She appears well-developed and well-nourished. No distress.  HENT:  Head: Normocephalic and atraumatic.  Right Ear: Hearing, tympanic membrane, external ear and ear canal normal.  Left Ear: Hearing, tympanic membrane, external ear and ear canal normal.  Nose: Mucosal edema and rhinorrhea (L>R) present.  Mouth/Throat: Uvula is midline, oropharynx is clear and moist  and mucous membranes are normal. No oropharyngeal exudate or posterior oropharyngeal erythema.  Eyes: Conjunctivae are normal. Pupils are equal, round, and reactive to light.  Neck: Neck supple. No thyromegaly present.  Cardiovascular: Normal rate, regular rhythm, S1 normal, S2 normal and normal heart sounds.  Exam reveals no gallop and no friction rub.   No murmur heard. Pulmonary/Chest: Effort normal and breath sounds normal. No  accessory muscle usage. No respiratory distress. She has no decreased breath sounds. She has no wheezes. She has no rhonchi. She has no rales.  Musculoskeletal: Normal range of motion.  Neurological: She is alert and oriented to person, place, and time.  Skin: Skin is warm and dry.  Psychiatric: She has a normal mood and affect. Her behavior is normal.  Nursing note and vitals reviewed.   BP 98/72 (BP Location: Right Arm, Patient Position: Sitting, Cuff Size: Normal)   Pulse (!) 105   Resp 16   Ht 5' 1.5" (1.562 m)   Wt 109 lb 12.8 oz (49.8 kg)   SpO2 98%   BMI 20.41 kg/m   Results for orders placed or performed in visit on 03/06/16  FSH/LH  Result Value Ref Range   FSH 6.3 mIU/mL   LH 5.3 mIU/mL  TSH  Result Value Ref Range   TSH 0.80 mIU/L  POCT CBC  Result Value Ref Range   WBC 8.7 4.6 - 10.2 K/uL   Lymph, poc 2.1 0.6 - 3.4   POC LYMPH PERCENT 24.2 10 - 50 %L   MID (cbc) 0.6 0 - 0.9   POC MID % 6.7 0 - 12 %M   POC Granulocyte 6.0 2 - 6.9   Granulocyte percent 69.1 37 - 80 %G   RBC 4.62 4.04 - 5.48 M/uL   Hemoglobin 14.3 12.2 - 16.2 g/dL   HCT, POC 16.1 09.6 - 47.9 %   MCV 87.3 80 - 97 fL   MCH, POC 30.9 27 - 31.2 pg   MCHC 35.4 31.8 - 35.4 g/dL   RDW, POC 04.5 %   Platelet Count, POC 324 142 - 424 K/uL   MPV 7.1 0 - 99.8 fL        Assessment & Plan:   1. Acute ethmoidal sinusitis, recurrence not specified   2. Family planning - pt is VERY concerned about unintentional pregnancy due to her age but still having regular menses and is recently married.  Unfortunately, she really could not tolerate even the very low dose OCPs we tried her on.  I think her best option may be an IUD or could try Depo-Provera - pt will consider.  Check hormone labs as if pt is peri-menopausal than much less chance of unintentional pregnancy so would not be as concerned about starting contraception.  3. Irregular menstrual cycle     Orders Placed This Encounter  Procedures  . FSH/LH    . TSH  . POCT CBC    Meds ordered this encounter  Medications  . pseudoephedrine-guaifenesin (MUCINEX D) 60-600 MG 12 hr tablet    Sig: Take 1 tablet by mouth every 12 (twelve) hours.    Dispense:  30 tablet    Refill:  0  . fluticasone (FLONASE) 50 MCG/ACT nasal spray    Sig: Place 2 sprays into both nostrils at bedtime.    Dispense:  16 g    Refill:  2  . azithromycin (ZITHROMAX) 250 MG tablet    Sig: Take 2 tabs PO x 1 dose, then 1 tab  PO QD x 4 days    Dispense:  6 tablet    Refill:  0  . benzonatate (TESSALON) 200 MG capsule    Sig: Take 1 capsule (200 mg total) by mouth 3 (three) times daily as needed for cough.    Dispense:  30 capsule    Refill:  0   Over 40 min spent in face-to-face evaluation of and consultation with patient and coordination of care.  Over 50% of this time was spent counseling this patient.  I personally performed the services described in this documentation, which was scribed in my presence. The recorded information has been reviewed and considered, and addended by me as needed.   Terri Sorenson, M.D.  [facility] [address] [place], [place] [address] [phone] phone [phone] fax  03/16/16 11:23 AM

## 2016-04-21 ENCOUNTER — Encounter: Payer: Self-pay | Admitting: Family Medicine

## 2016-04-21 ENCOUNTER — Ambulatory Visit (INDEPENDENT_AMBULATORY_CARE_PROVIDER_SITE_OTHER): Payer: BLUE CROSS/BLUE SHIELD | Admitting: Family Medicine

## 2016-04-21 VITALS — BP 116/72 | HR 84 | Temp 98.1°F | Resp 18 | Ht 61.5 in | Wt 113.0 lb

## 2016-04-21 DIAGNOSIS — Z1389 Encounter for screening for other disorder: Secondary | ICD-10-CM

## 2016-04-21 DIAGNOSIS — Z1383 Encounter for screening for respiratory disorder NEC: Secondary | ICD-10-CM

## 2016-04-21 DIAGNOSIS — Z113 Encounter for screening for infections with a predominantly sexual mode of transmission: Secondary | ICD-10-CM | POA: Diagnosis not present

## 2016-04-21 DIAGNOSIS — Z0000 Encounter for general adult medical examination without abnormal findings: Secondary | ICD-10-CM | POA: Diagnosis not present

## 2016-04-21 DIAGNOSIS — Z23 Encounter for immunization: Secondary | ICD-10-CM

## 2016-04-21 DIAGNOSIS — Z130 Encounter for screening for diseases of the blood and blood-forming organs and certain disorders involving the immune mechanism: Secondary | ICD-10-CM

## 2016-04-21 DIAGNOSIS — Z1329 Encounter for screening for other suspected endocrine disorder: Secondary | ICD-10-CM

## 2016-04-21 DIAGNOSIS — Z136 Encounter for screening for cardiovascular disorders: Secondary | ICD-10-CM

## 2016-04-21 LAB — POCT URINALYSIS DIP (MANUAL ENTRY)
BILIRUBIN UA: NEGATIVE
BILIRUBIN UA: NEGATIVE
GLUCOSE UA: NEGATIVE
Nitrite, UA: NEGATIVE
Protein Ur, POC: NEGATIVE
SPEC GRAV UA: 1.025
Urobilinogen, UA: 0.2
pH, UA: 6

## 2016-04-21 LAB — COMPREHENSIVE METABOLIC PANEL
ALBUMIN: 4.4 g/dL (ref 3.6–5.1)
ALT: 16 U/L (ref 6–29)
AST: 18 U/L (ref 10–35)
Alkaline Phosphatase: 50 U/L (ref 33–130)
BILIRUBIN TOTAL: 1.1 mg/dL (ref 0.2–1.2)
BUN: 15 mg/dL (ref 7–25)
CALCIUM: 9.6 mg/dL (ref 8.6–10.4)
CHLORIDE: 104 mmol/L (ref 98–110)
CO2: 24 mmol/L (ref 20–31)
Creat: 0.67 mg/dL (ref 0.50–1.05)
Glucose, Bld: 103 mg/dL — ABNORMAL HIGH (ref 65–99)
Potassium: 4.5 mmol/L (ref 3.5–5.3)
Sodium: 138 mmol/L (ref 135–146)
Total Protein: 7.3 g/dL (ref 6.1–8.1)

## 2016-04-21 LAB — HIV ANTIBODY (ROUTINE TESTING W REFLEX): HIV 1&2 Ab, 4th Generation: NONREACTIVE

## 2016-04-21 LAB — CBC
HEMATOCRIT: 43.5 % (ref 35.0–45.0)
Hemoglobin: 14.8 g/dL (ref 11.7–15.5)
MCH: 30.6 pg (ref 27.0–33.0)
MCHC: 34 g/dL (ref 32.0–36.0)
MCV: 89.9 fL (ref 80.0–100.0)
MPV: 8.9 fL (ref 7.5–12.5)
Platelets: 299 10*3/uL (ref 140–400)
RBC: 4.84 MIL/uL (ref 3.80–5.10)
RDW: 12.8 % (ref 11.0–15.0)
WBC: 9.8 10*3/uL (ref 3.8–10.8)

## 2016-04-21 LAB — LIPID PANEL
Cholesterol: 245 mg/dL — ABNORMAL HIGH (ref 125–200)
HDL: 53 mg/dL (ref >=46)
LDL CALC: 142 mg/dL — AB (ref <130)
TRIGLYCERIDES: 252 mg/dL — AB (ref <150)
Total CHOL/HDL Ratio: 4.6 Ratio (ref <=5.0)
VLDL: 50 mg/dL — AB (ref <30)

## 2016-04-21 LAB — TSH: TSH: 1.96 mIU/L

## 2016-04-21 NOTE — Patient Instructions (Addendum)
IF you received an x-ray today, you will receive an invoice from [facility]. Please contact [facility] at [phone] with questions or concerns regarding your invoice.   IF you received labwork today, you will receive an invoice from [facility]. Please contact [facility] at [phone] with questions or concerns regarding your invoice.   Our billing staff will not be able to assist you with questions regarding bills from these companies.  You will be contacted with the lab results as soon as they are available. The fastest way to get your results is to activate your My Chart account. Instructions are located on the last page of this paperwork. If you have not heard from us regarding the results in 2 weeks, please contact this office.  Eating Healthy on a Budget There are many ways to save money at the grocery store and continue to eat healthy. You can be successful if you plan your meals according to your budget, purchase according to your budget and grocery list, and prepare food yourself.  How can I buy more food on a limited budget? Plan  Plan meals and snacks according to a grocery list and budget you create.  Look for recipes where you can cook once and make enough food for two meals.  Include meals that will "stretch" more expensive foods such as stews, casseroles, and stir-fry dishes.  Make a grocery list and make sure to bring it with you to the store. If you have a smart phone, you could use your phone to create your shopping list. Purchase  When grocery shopping, buy only the items on your grocery list and go only to the areas of the store that have the items on your list. Prepare  Some meal items can be prepared in advance. Pre-cook on days when you have extra time.  Make extra food (such as by doubling recipes) and freeze the extras in meal-sized containers or in individual portions for fast meals and snacks.  Use leftovers in  your meal plan for the week.  Try some meatless meals or try "no cook" meals like salads.  When you come home from the grocery store, wash and prepare your fruits and vegetables so they are ready to use and eat. This will help reduce food waste. How can I buy more food on a limited budget?  Try these tips the next time you go shopping:   Buy store brands or generic brands.  Use coupons only for foods and brands you normally buy. Avoid buying items you wouldn't normally buy simply because they are on sale.  Check online and in newspapers for weekly deals.  Buy healthy items from the bulk bins when available, such as herbs, spices, flours, pastas, nuts, and dried fruit.  Buy fruits and vegetables that are in season. Prices are usually lower on in-season produce.  Compare and contrast different items. You can do this by looking at the unit price on the price tag. Use it to compare different brands and sizes to find out which item is the best deal.  Choose naturally low-cost healthy items, such as carrots, potatoes, apples, bananas, and oranges. Dried or canned beans are a low-cost protein source.  Buy in bulk and freeze extra food. Items you can buy in bulk include meats, fish, poultry, frozen fruits, and frozen vegetables.  Limit the purchase of prepared or "ready-to-eat" foods, such as pre-cut fruits and vegetables and pre-made salads.  If possible, shop around to discover which grocery   store offers the best prices. Some stores charge much more than other stores for the same items.  Do not shop when you are hungry. If you shop while hungry, It may be hard to stick to your list and budget.  Stick to your list and resist impulse buys. Treat your list as your official plan for the week.  Buy a variety of vegetables and fruit by purchasing fresh, frozen, and canned items.  Look beyond eye level. Foods at eye level (adult or child eye level) are more expensive. Look at the top and bottom  shelves for deals.  Be efficient with your time when shopping. The more time you spend at the store, the more money you are likely to spend.  Consider other retailers such as dollar stores, larger wholesale stores, local fruit and vegetable stands, and farmers markets. What are some tips for less expensive food substitutions? When choosing more expensive foods like meats and dairy, try these tips to save money:  Choose cheaper cuts of meat, such as bone-in chicken thighs and drumsticks instead skinless and boneless chicken. When you are ready to prepare the chicken, you can remove the skin yourself to make it healthier.  Choose lean meats like chicken or turkey. When choosing ground beef, make sure it is lean ground beef (92% lean, 8% fat). If you do buy a fattier ground beef, drain the fat before eating.  Buy dried beans and peas, such as lentils, split peas, or kidney beans.  For seafood, choose canned tuna, salmon, or sardines.  Eggs are a low-cost source of protein.  Buy the larger tubs of yogurt instead of individual-sized containers.  Choose water instead of sodas and other sweetened beverages.  Skip buying chips, cookies, and other "junk food". These items are usually expensive, high in calories, and low in nutritional value. How can I prepare the foods I buy in the healthiest way? Practice these tips for cooking foods in the healthiest way to reduce excess fat and calorie intake:  Steam, saute, grill, or bake foods instead of frying them.  Make sure half your plate is filled with fruits or vegetables. Choose from fresh, frozen, or canned fruits and vegetables. If eating canned, remember to rinse them before eating. This will remove any excess salt added for packaging.  Trim all fat from meat before cooking. Remove the skin from chicken or turkey.  Spoon off fat from meat dishes once they have been chilled in the refrigerator and the fat has hardened on the top.  Use skim  milk, low-fat milk, or evaporated skim milk when making cream sauces, soups, or puddings.  Substitute low-fat yogurt, sour cream, or cottage cheese for sour cream and mayonnaise in dips and dressings.  Try lemon juice, herbs, or spices to season food instead of salt, butter, or margarine.   This information is not intended to replace advice given to you by your health care provider. Make sure you discuss any questions you have with your health care provider.   Document Released: 03/02/2014 Document Reviewed: 03/02/2014 Elsevier Interactive Patient Education 2016 Elsevier Inc.  
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
## 2016-07-25 IMAGING — CR DG CHEST 2V
2 series · 2 of 2 positions shown · non-contrast
Comparison: 02/19/2015.

CLINICAL DATA: Shortness breath .

EXAM:
CHEST  2 VIEW

[PA]
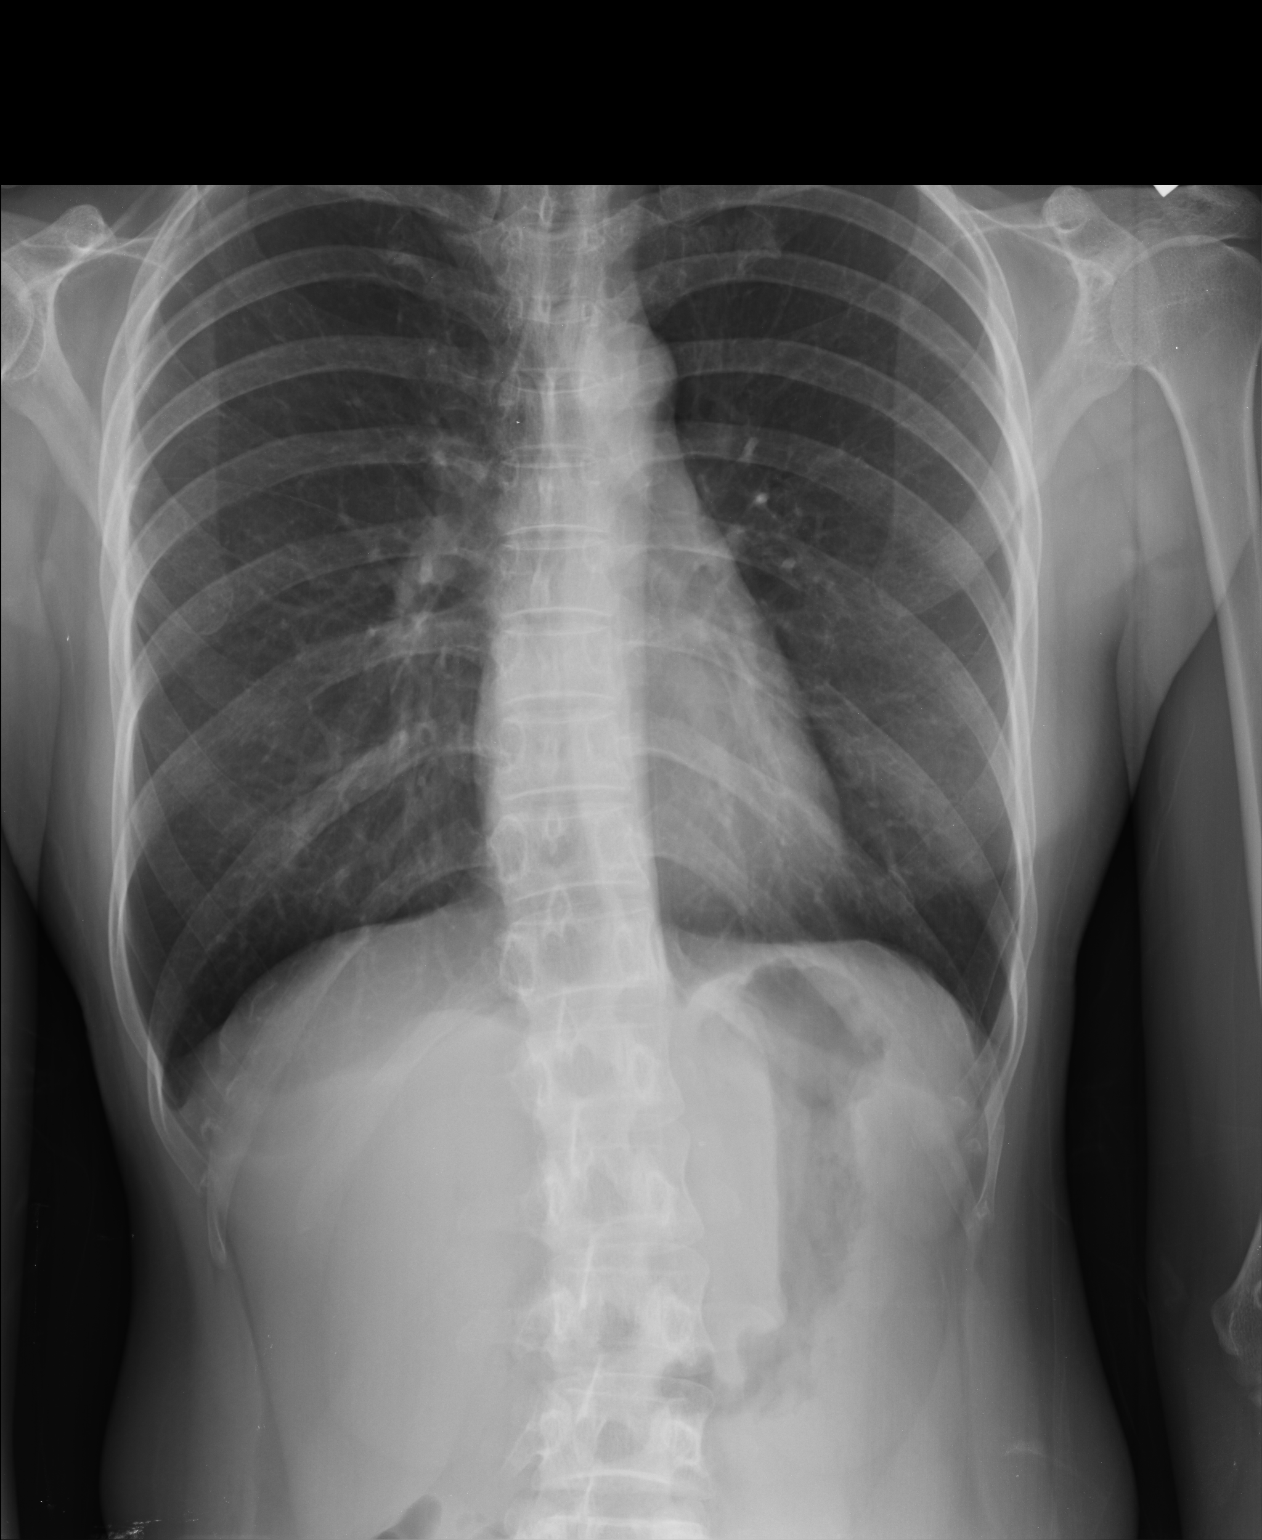

[lateral]
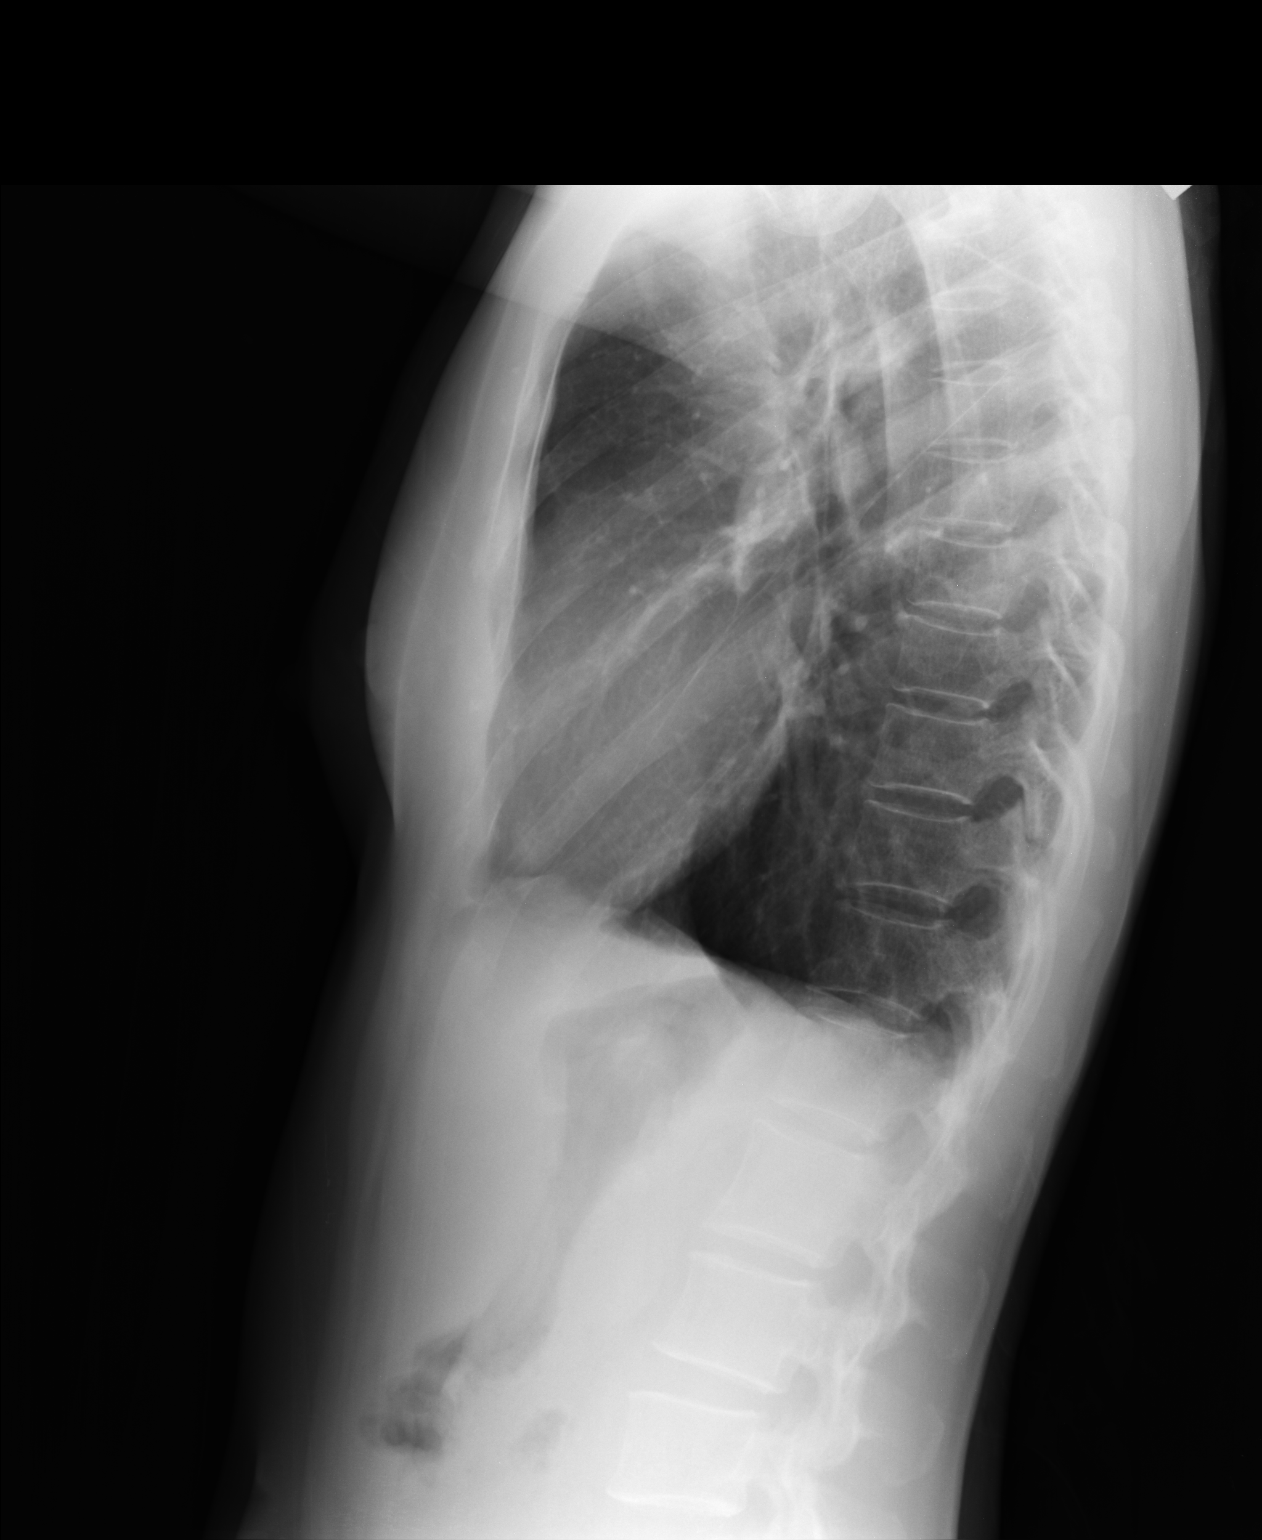

[2 of 2 positions shown; findings below may reference images not displayed]

FINDINGS: Mediastinum and hilar structures normal. Lungs are clear. Heart size
normal. No pleural effusion or pneumothorax. Chest is stable from
prior exam.
IMPRESSION: No acute cardiopulmonary disease.

## 2016-09-01 IMAGING — MG MM DIGITAL SCREENING BILAT
4 series · 4 of 4 positions shown · non-contrast
Comparison: None.

CLINICAL DATA: Screening.

EXAM:
DIGITAL SCREENING BILATERAL MAMMOGRAM WITH CAD

[L CC]
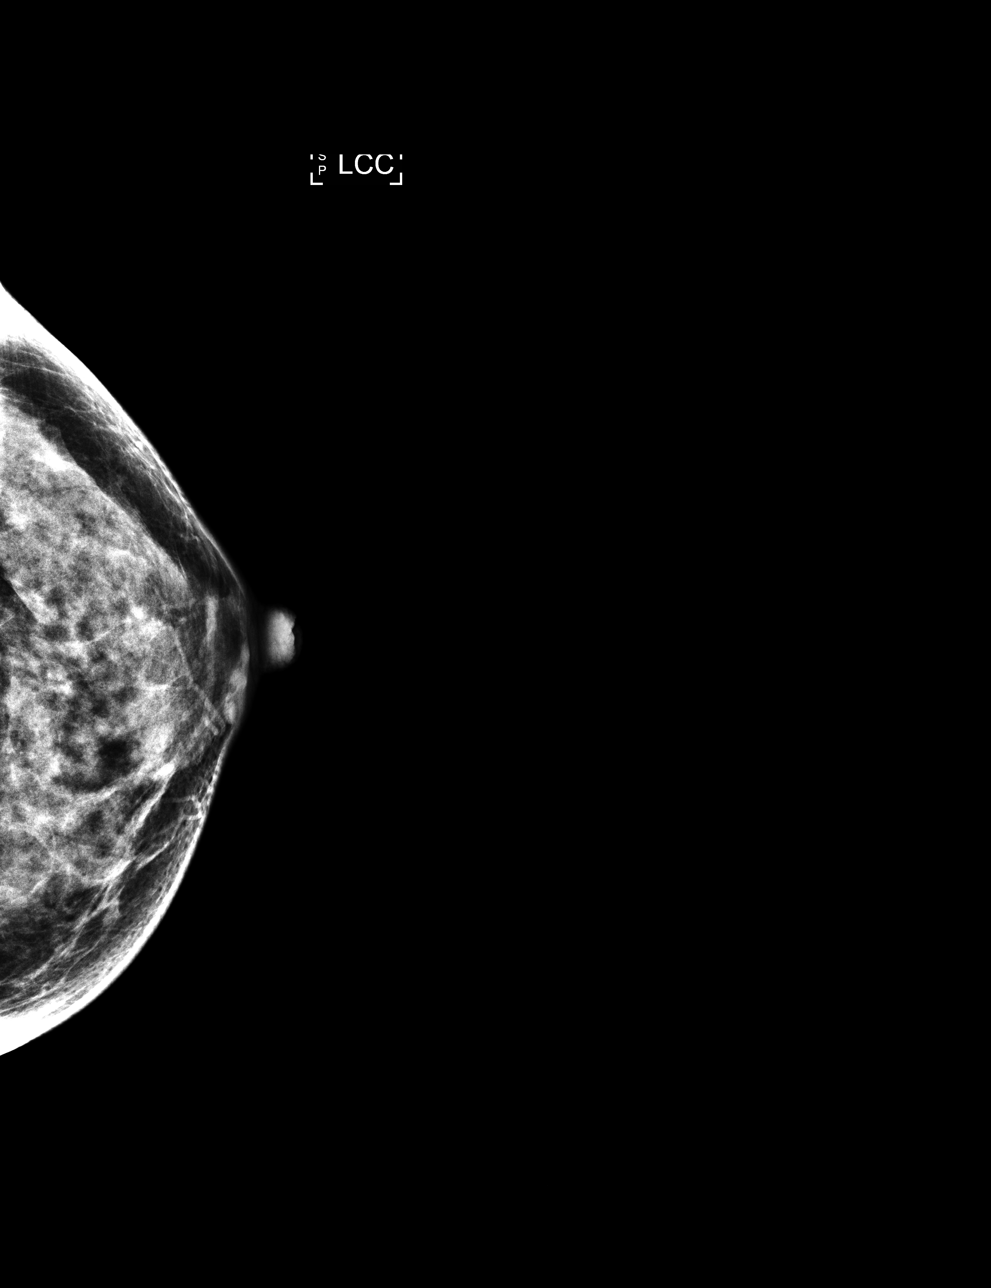

[R MLO]
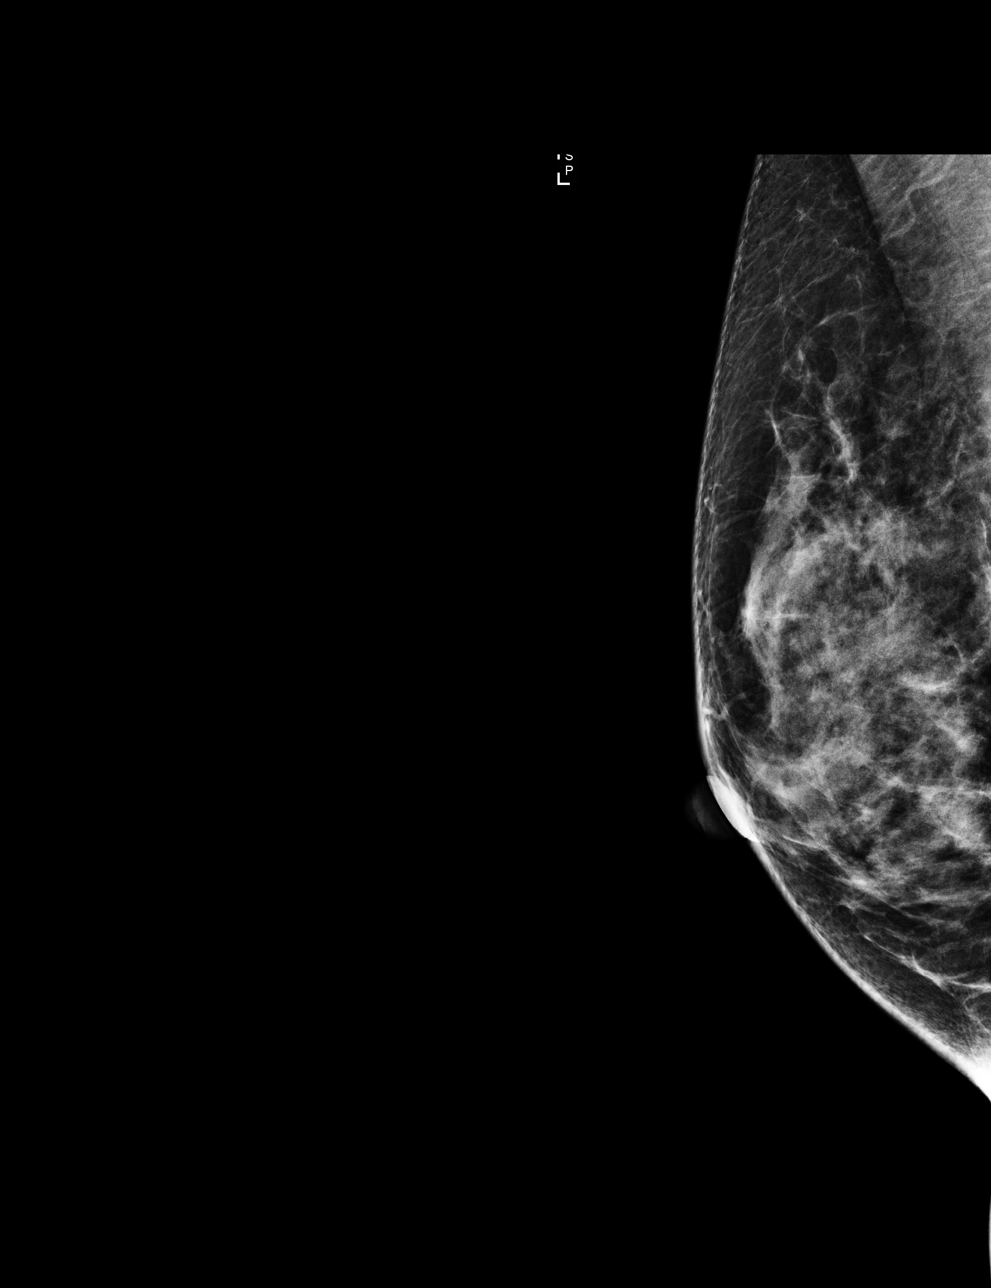

[R CC]
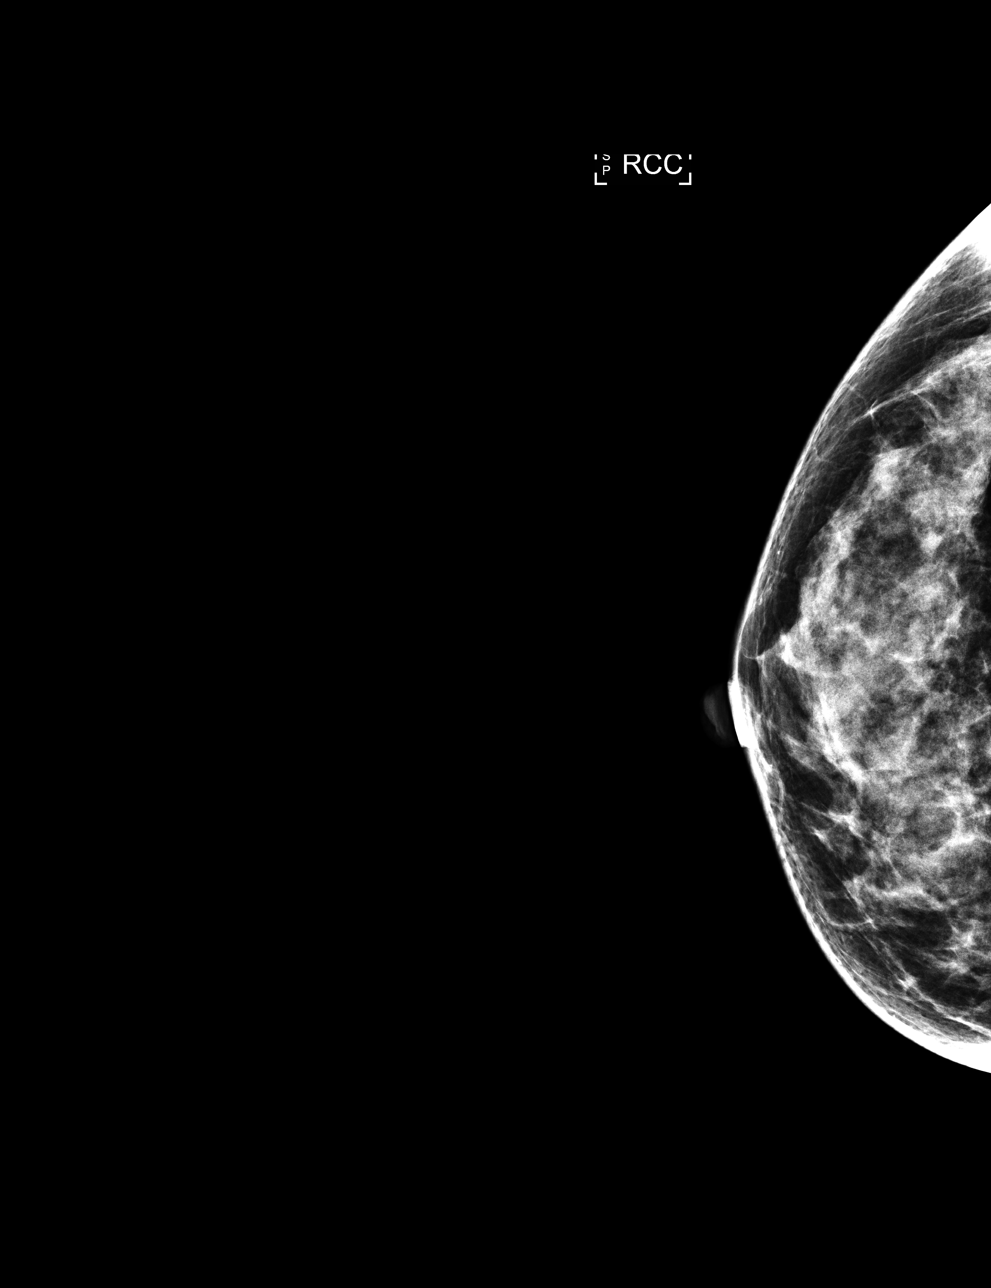

[L MLO]
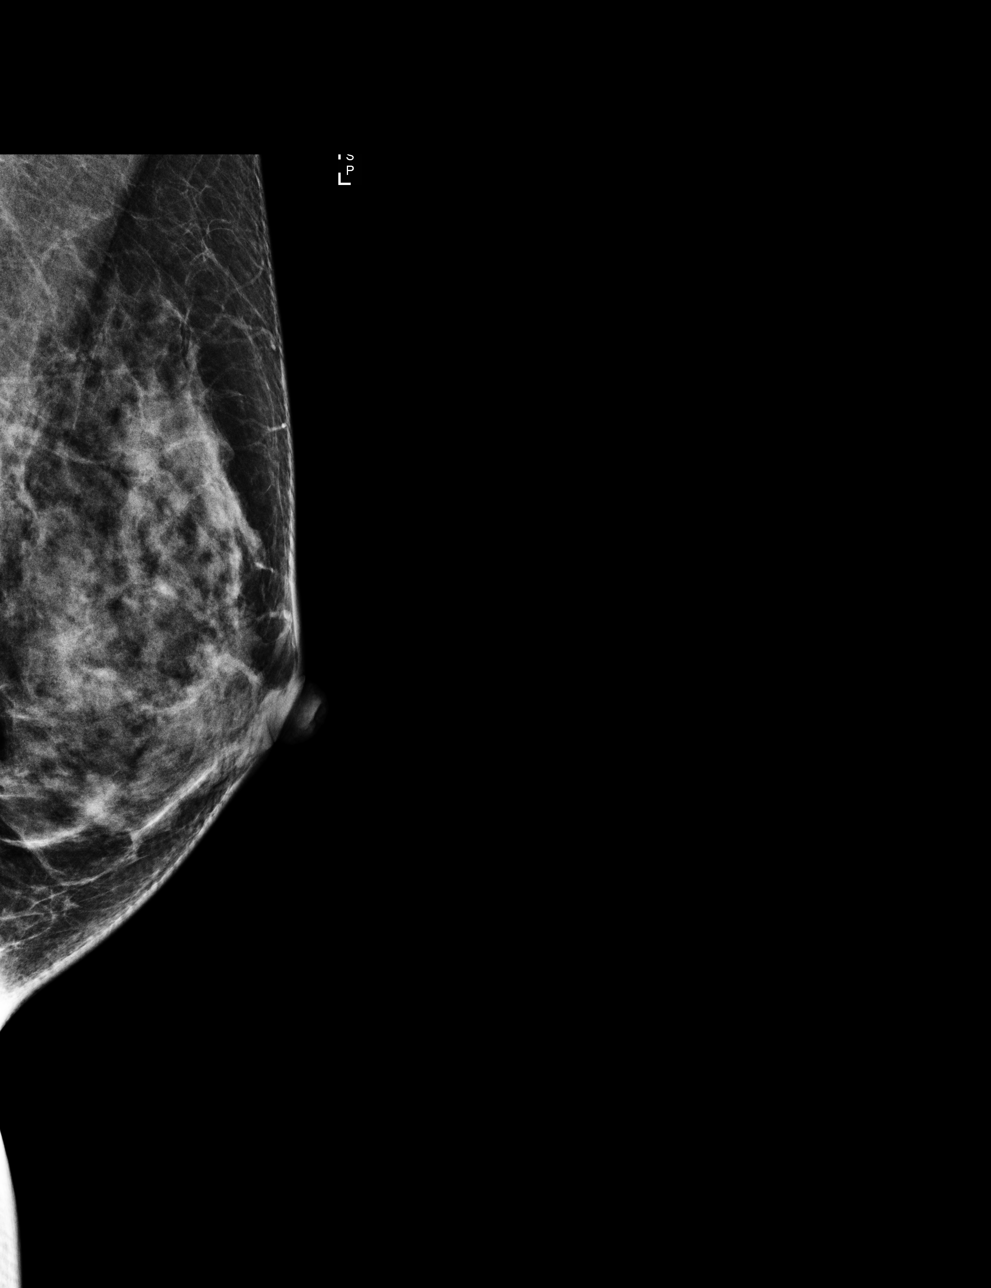

[4 of 4 positions shown; findings below may reference images not displayed]

ACR Breast Density Category c: The breast tissue is heterogeneously
dense, which may obscure small masses
FINDINGS: There are no findings suspicious for malignancy. Images were
processed with CAD.
IMPRESSION: No mammographic evidence of malignancy. A result letter of this
screening mammogram will be mailed directly to the patient.

RECOMMENDATION:
Screening mammogram in one year. (Code:U2-0-761)

BI-RADS CATEGORY  1: Negative.

## 2016-12-21 ENCOUNTER — Encounter: Payer: Self-pay | Admitting: Physician Assistant

## 2016-12-21 ENCOUNTER — Ambulatory Visit (INDEPENDENT_AMBULATORY_CARE_PROVIDER_SITE_OTHER): Payer: BLUE CROSS/BLUE SHIELD | Admitting: Physician Assistant

## 2016-12-21 VITALS — BP 113/70 | HR 76 | Temp 98.9°F | Resp 16 | Ht 61.5 in | Wt 111.0 lb

## 2016-12-21 DIAGNOSIS — J329 Chronic sinusitis, unspecified: Secondary | ICD-10-CM | POA: Diagnosis not present

## 2016-12-21 MED ORDER — FLUTICASONE PROPIONATE 50 MCG/ACT NA SUSP: 2.0000 | Freq: Every day | NASAL | 12 refills | Status: DC

## 2016-12-21 MED ORDER — AMOXICILLIN-POT CLAVULANATE 875-125 MG PO TABS: 1.0000 | ORAL_TABLET | Freq: Two times a day (BID) | ORAL | 0 refills | Status: DC

## 2016-12-21 NOTE — Patient Instructions (Addendum)
  I would like you to take the zyrtec (cetizine) at this time.   I would like you to take the antibiotic twice per day as prescribed. If you continue to have the nasal pain after 2 weeks, then you must return.  We will consider imaging.     IF you received an x-ray today, you will receive an invoice from [facility]. Please contact [facility] at [phone] with questions or concerns regarding your invoice.   IF you received labwork today, you will receive an invoice from [facility]. Please contact [facility] at [phone] with questions or concerns regarding your invoice.   Our billing staff will not be able to assist you with questions regarding bills from these companies.  You will be contacted with the lab results as soon as they are available. The fastest way to get your results is to activate your My Chart account. Instructions are located on the last page of this paperwork. If you have not heard from us regarding the results in 2 weeks, please contact this office.

## 2016-12-21 NOTE — Progress Notes (Signed)
PRIMARY CARE AT [facility] [address] [place] [address] [phone]  Date:  12/21/2016   Name:  Terri Wall   DOB:  08-Feb-1966   MRN:  [id number]  PCP:  Sherren Mocha, MD    History of Present Illness:  Terri Wall is a 51 y.o. female patient who presents to PCP with  Chief Complaint  Patient presents with  . Facial Swelling    left side, HA and sinus pressure if she presses the left side of her nose x 1week     She has sinus pressure, and headache.  She felt that the left side of neck had pain.  Symptoms started 1 week ago.  She attempted warm water but this did not help.  She felt fever.  She has mild congestion of left nostril.  She has no throat pain.  She has mild cough.  Non-productive.  No dyspnea.     Patient Active Problem List   Diagnosis Date Noted  . Adjustment disorder with mixed anxiety and depressed mood 03/22/2015  . GERD (gastroesophageal reflux disease) 12/06/2013  . Allergy 12/06/2013  . History of positive PPD, treatment status unknown 12/06/2013  . Vitamin D deficiency 09/20/2011    Past Medical History:  Diagnosis Date  . Allergy   . Anxiety   . History of positive PPD, treatment status unknown   . Reflux     History reviewed. No pertinent surgical history.  Social History  Substance Use Topics  . Smoking status: Never Smoker  . Smokeless tobacco: Never Used  . Alcohol use No    History reviewed. No pertinent family history.  Allergies  Allergen Reactions  . Doxycycline Hives  . Tetracyclines & Related Hives    Medication list has been reviewed and updated.  Current Outpatient Prescriptions on File Prior to Visit  Medication Sig Dispense Refill  . Multiple Vitamins-Minerals (MULTIVITAMIN WITH MINERALS) tablet Take 1 tablet by mouth daily. Reported on 09/30/2015    . ALPRAZolam (XANAX) 0.25 MG tablet 1/2 tab to 1 tab po night for insomnia prn (Patient not taking: Reported on 04/21/2016) 30 tablet 0  . lactose free nutrition (BOOST) LIQD  Take 237 mLs by mouth once. Reported on 09/30/2015     No current facility-administered medications on file prior to visit.     ROS ROS otherwise unremarkable unless listed above.  Physical Examination: BP 113/70 (BP Location: Right Arm, Patient Position: Sitting, Cuff Size: Small)   Pulse 76   Temp 98.9 F (37.2 C) (Oral)   Resp 16   Ht 5' 1.5" (1.562 m)   Wt 111 lb (50.3 kg)   LMP 11/15/2016   SpO2 100%   BMI 20.63 kg/m  Ideal Body Weight: Weight in (lb) to have BMI = 25: 134.2  Physical Exam  Constitutional: She is oriented to person, place, and time. She appears well-developed and well-nourished. No distress.  HENT:  Head: Normocephalic and atraumatic.  Right Ear: External ear normal.  Left Ear: External ear normal.  Nose: Mucosal edema present. Right sinus exhibits no maxillary sinus tenderness and no frontal sinus tenderness. Left sinus exhibits maxillary sinus tenderness. Left sinus exhibits no frontal sinus tenderness.  Eyes: Conjunctivae and EOM are normal. Pupils are equal, round, and reactive to light.  Cardiovascular: Normal rate.   Pulmonary/Chest: Effort normal. No respiratory distress.  Neurological: She is alert and oriented to person, place, and time.  Skin: She is not diaphoretic.  Psychiatric: She has a normal mood and affect. Her  behavior is normal.     Assessment and Plan: Terri Wall is a 51 y.o. female who is here today for sinus pain of the left side. Sinusitis, unspecified chronicity, unspecified location - Plan: amoxicillin-clavulanate (AUGMENTIN) 875-125 MG tablet, fluticasone (FLONASE) 50 MCG/ACT nasal spray  Trena PlattStephanie Kery Haltiwanger, PA-C [facility] 6/14/20188:43 AM Addendum: discussed with pharmacy that she has an allergy to amoxicillin, that was not on EPIC.  This was updated.  Pharmacist recommends cefdinir, as this may not cause cross reaction, and allergic reaction.

## 2016-12-22 ENCOUNTER — Telehealth: Payer: Self-pay | Admitting: Family Medicine

## 2016-12-22 NOTE — Telephone Encounter (Signed)
Judeth CornfieldStephanie spoke with pharmacist regarding PCN allergy not mentioned during visit. Pharmacist related to provider that she can try the second/third generation Cephalosporin class and will call the patient directly with changes/ new medication.

## 2016-12-22 NOTE — Telephone Encounter (Signed)
PT CALLING TO LET STEPHANIE KNOW THAT PHARMACY WANT FILL RX FOR AMOXICILLIN BECAUSE PT IS ALLERGIC TO THE DRUG P[facility]

## 2017-01-19 ENCOUNTER — Other Ambulatory Visit: Payer: Self-pay | Admitting: Family Medicine

## 2017-01-25 ENCOUNTER — Other Ambulatory Visit: Payer: Self-pay | Admitting: Family Medicine

## 2017-05-06 ENCOUNTER — Other Ambulatory Visit: Payer: Self-pay | Admitting: Family Medicine
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
## 2017-05-24 ENCOUNTER — Ambulatory Visit (INDEPENDENT_AMBULATORY_CARE_PROVIDER_SITE_OTHER): Payer: BLUE CROSS/BLUE SHIELD | Admitting: Family Medicine

## 2017-05-24 ENCOUNTER — Encounter: Payer: Self-pay | Admitting: Family Medicine

## 2017-05-24 ENCOUNTER — Other Ambulatory Visit: Payer: Self-pay

## 2017-05-24 VITALS — BP 108/60 | HR 77 | Temp 98.3°F | Resp 18 | Ht 61.5 in | Wt 115.4 lb

## 2017-05-24 DIAGNOSIS — Z136 Encounter for screening for cardiovascular disorders: Secondary | ICD-10-CM

## 2017-05-24 DIAGNOSIS — Z0000 Encounter for general adult medical examination without abnormal findings: Secondary | ICD-10-CM

## 2017-05-24 DIAGNOSIS — R3121 Asymptomatic microscopic hematuria: Secondary | ICD-10-CM | POA: Diagnosis not present

## 2017-05-24 DIAGNOSIS — Z23 Encounter for immunization: Secondary | ICD-10-CM

## 2017-05-24 DIAGNOSIS — Z130 Encounter for screening for diseases of the blood and blood-forming organs and certain disorders involving the immune mechanism: Secondary | ICD-10-CM

## 2017-05-24 DIAGNOSIS — Z1231 Encounter for screening mammogram for malignant neoplasm of breast: Secondary | ICD-10-CM | POA: Diagnosis not present

## 2017-05-24 DIAGNOSIS — Z1383 Encounter for screening for respiratory disorder NEC: Secondary | ICD-10-CM | POA: Diagnosis not present

## 2017-05-24 DIAGNOSIS — Z1329 Encounter for screening for other suspected endocrine disorder: Secondary | ICD-10-CM

## 2017-05-24 DIAGNOSIS — Z1389 Encounter for screening for other disorder: Secondary | ICD-10-CM

## 2017-05-24 LAB — POCT URINALYSIS DIP (MANUAL ENTRY)
BILIRUBIN UA: NEGATIVE
BILIRUBIN UA: NEGATIVE mg/dL
GLUCOSE UA: NEGATIVE mg/dL
Leukocytes, UA: NEGATIVE
NITRITE UA: NEGATIVE
PH UA: 7.5 (ref 5.0–8.0)
Protein Ur, POC: NEGATIVE mg/dL
SPEC GRAV UA: 1.025 (ref 1.010–1.025)
Urobilinogen, UA: 0.2 E.U./dL

## 2017-05-24 LAB — POC MICROSCOPIC URINALYSIS (UMFC): MUCUS RE: ABSENT

## 2017-05-24 NOTE — Patient Instructions (Addendum)
You need to schedule your mammogram.  Please call [facility] at [phone] to schedule.  Start a daily calcium supplement.  After 07/13/2017 - try to go off of your stomach acid medicine. If you symptoms come back, come in to see me before you restart the acid reducer so we can make sure you don't have a bacterial infection or ulcer making the problem worse.   IF you received an x-ray today, you will receive an invoice from [facility]. Please contact [facility] at [phone] with questions or concerns regarding your invoice.   IF you received labwork today, you will receive an invoice from [facility]. Please contact [facility] at [phone] with questions or concerns regarding your invoice.   Our billing staff will not be able to assist you with questions regarding bills from these companies.  You will be contacted with the lab results as soon as they are available. The fastest way to get your results is to activate your My Chart account. Instructions are located on the last page of this paperwork. If you have not heard from [place] regarding the results in 2 weeks, please contact this office.    Health Maintenance, Female Adopting a healthy lifestyle and getting preventive care can go a long way to promote health and wellness. Talk with your health care provider about what schedule of regular examinations is right for you. This is a good chance for you to check in with your provider about disease prevention and staying healthy. In between checkups, there are plenty of things you can do on your own. Experts have done a lot of research about which lifestyle changes and preventive measures are most likely to keep you healthy. Ask your health care provider for more information. Weight and diet Eat a healthy diet  Be sure to include plenty of vegetables, fruits, low-fat dairy products, and lean protein.  Do not eat a lot of foods high in solid fats,  added sugars, or salt.  Get regular exercise. This is one of the most important things you can do for your health. ? Most adults should exercise for at least 150 minutes each week. The exercise should increase your heart rate and make you sweat (moderate-intensity exercise). ? Most adults should also do strengthening exercises at least twice a week. This is in addition to the moderate-intensity exercise.  Maintain a healthy weight  Body mass index (BMI) is a measurement that can be used to identify possible weight problems. It estimates body fat based on height and weight. Your health care provider can help determine your BMI and help you achieve or maintain a healthy weight.  For females 43 years of age and older: ? A BMI below 18.5 is considered underweight. ? A BMI of 18.5 to 24.9 is normal. ? A BMI of 25 to 29.9 is considered overweight. ? A BMI of 30 and above is considered obese.  Watch levels of cholesterol and blood lipids  You should start having your blood tested for lipids and cholesterol at 51 years of age, then have this test every 5 years.  You may need to have your cholesterol levels checked more often if: ? Your lipid or cholesterol levels are high. ? You are older than 51 years of age. ? You are at high risk for heart disease.  Cancer screening Lung Cancer  Lung cancer screening is recommended for adults 61-22 years old who are at high risk for lung cancer because of a history of  smoking.  A yearly low-dose CT scan of the lungs is recommended for people who: ? Currently smoke. ? Have quit within the past 15 years. ? Have at least a 30-pack-year history of smoking. A pack year is smoking an average of one pack of cigarettes a day for 1 year.  Yearly screening should continue until it has been 15 years since you quit.  Yearly screening should stop if you develop a health problem that would prevent you from having lung cancer treatment.  Breast Cancer  Practice  breast self-awareness. This means understanding how your breasts normally appear and feel.  It also means doing regular breast self-exams. Let your health care provider know about any changes, no matter how small.  If you are in your 20s or 30s, you should have a clinical breast exam (CBE) by a health care provider every 1-3 years as part of a regular health exam.  If you are 78 or older, have a CBE every year. Also consider having a breast X-ray (mammogram) every year.  If you have a family history of breast cancer, talk to your health care provider about genetic screening.  If you are at high risk for breast cancer, talk to your health care provider about having an MRI and a mammogram every year.  Breast cancer gene (BRCA) assessment is recommended for women who have family members with BRCA-related cancers. BRCA-related cancers include: ? Breast. ? Ovarian. ? Tubal. ? Peritoneal cancers.  Results of the assessment will determine the need for genetic counseling and BRCA1 and BRCA2 testing.  Cervical Cancer Your health care provider may recommend that you be screened regularly for cancer of the pelvic organs (ovaries, uterus, and vagina). This screening involves a pelvic examination, including checking for microscopic changes to the surface of your cervix (Pap test). You may be encouraged to have this screening done every 3 years, beginning at age 15.  For women ages 49-65, health care providers may recommend pelvic exams and Pap testing every 3 years, or they may recommend the Pap and pelvic exam, combined with testing for human papilloma virus (HPV), every 5 years. Some types of HPV increase your risk of cervical cancer. Testing for HPV may also be done on women of any age with unclear Pap test results.  Other health care providers may not recommend any screening for nonpregnant women who are considered low risk for pelvic cancer and who do not have symptoms. Ask your health care provider  if a screening pelvic exam is right for you.  If you have had past treatment for cervical cancer or a condition that could lead to cancer, you need Pap tests and screening for cancer for at least 20 years after your treatment. If Pap tests have been discontinued, your risk factors (such as having a new sexual partner) need to be reassessed to determine if screening should resume. Some women have medical problems that increase the chance of getting cervical cancer. In these cases, your health care provider may recommend more frequent screening and Pap tests.  Colorectal Cancer  This type of cancer can be detected and often prevented.  Routine colorectal cancer screening usually begins at 51 years of age and continues through 51 years of age.  Your health care provider may recommend screening at an earlier age if you have risk factors for colon cancer.  Your health care provider may also recommend using home test kits to check for hidden blood in the stool.  A small camera at  the end of a tube can be used to examine your colon directly (sigmoidoscopy or colonoscopy). This is done to check for the earliest forms of colorectal cancer.  Routine screening usually begins at age 38.  Direct examination of the colon should be repeated every 5-10 years through 51 years of age. However, you may need to be screened more often if early forms of precancerous polyps or small growths are found.  Skin Cancer  Check your skin from head to toe regularly.  Tell your health care provider about any new moles or changes in moles, especially if there is a change in a mole's shape or color.  Also tell your health care provider if you have a mole that is larger than the size of a pencil eraser.  Always use sunscreen. Apply sunscreen liberally and repeatedly throughout the day.  Protect yourself by wearing long sleeves, pants, a wide-brimmed hat, and sunglasses whenever you are outside.  Heart disease,  diabetes, and high blood pressure  High blood pressure causes heart disease and increases the risk of stroke. High blood pressure is more likely to develop in: ? People who have blood pressure in the high end of the normal range (130-139/85-89 mm Hg). ? People who are overweight or obese. ? People who are African American.  If you are 24-54 years of age, have your blood pressure checked every 3-5 years. If you are 84 years of age or older, have your blood pressure checked every year. You should have your blood pressure measured twice-once when you are at a hospital or clinic, and once when you are not at a hospital or clinic. Record the average of the two measurements. To check your blood pressure when you are not at a hospital or clinic, you can use: ? An automated blood pressure machine at a pharmacy. ? A home blood pressure monitor.  If you are between 63 years and 44 years old, ask your health care provider if you should take aspirin to prevent strokes.  Have regular diabetes screenings. This involves taking a blood sample to check your fasting blood sugar level. ? If you are at a normal weight and have a low risk for diabetes, have this test once every three years after 51 years of age. ? If you are overweight and have a high risk for diabetes, consider being tested at a younger age or more often. Preventing infection Hepatitis B  If you have a higher risk for hepatitis B, you should be screened for this virus. You are considered at high risk for hepatitis B if: ? You were born in a country where hepatitis B is common. Ask your health care provider which countries are considered high risk. ? Your parents were born in a high-risk country, and you have not been immunized against hepatitis B (hepatitis B vaccine). ? You have HIV or AIDS. ? You use needles to inject street drugs. ? You live with someone who has hepatitis B. ? You have had sex with someone who has hepatitis B. ? You get  hemodialysis treatment. ? You take certain medicines for conditions, including cancer, organ transplantation, and autoimmune conditions.  Hepatitis C  Blood testing is recommended for: ? Everyone born from [birth year] through 1965. ? Anyone with known risk factors for hepatitis C.  Sexually transmitted infections (STIs)  You should be screened for sexually transmitted infections (STIs) including gonorrhea and chlamydia if: ? You are sexually active and are younger than 51 years of age. ?  You are older than 51 years of age and your health care provider tells you that you are at risk for this type of infection. ? Your sexual activity has changed since you were last screened and you are at an increased risk for chlamydia or gonorrhea. Ask your health care provider if you are at risk.  If you do not have HIV, but are at risk, it may be recommended that you take a prescription medicine daily to prevent HIV infection. This is called pre-exposure prophylaxis (PrEP). You are considered at risk if: ? You are sexually active and do not regularly use condoms or know the HIV status of your partner(s). ? You take drugs by injection. ? You are sexually active with a partner who has HIV.  Talk with your health care provider about whether you are at high risk of being infected with HIV. If you choose to begin PrEP, you should first be tested for HIV. You should then be tested every 3 months for as long as you are taking PrEP. Pregnancy  If you are premenopausal and you may become pregnant, ask your health care provider about preconception counseling.  If you may become pregnant, take 400 to 800 micrograms (mcg) of folic acid every day.  If you want to prevent pregnancy, talk to your health care provider about birth control (contraception). Osteoporosis and menopause  Osteoporosis is a disease in which the bones lose minerals and strength with aging. This can result in serious bone fractures. Your risk for  osteoporosis can be identified using a bone density scan.  If you are 42 years of age or older, or if you are at risk for osteoporosis and fractures, ask your health care provider if you should be screened.  Ask your health care provider whether you should take a calcium or vitamin D supplement to lower your risk for osteoporosis.  Menopause may have certain physical symptoms and risks.  Hormone replacement therapy may reduce some of these symptoms and risks. Talk to your health care provider about whether hormone replacement therapy is right for you. Follow these instructions at home:  Schedule regular health, dental, and eye exams.  Stay current with your immunizations.  Do not use any tobacco products including cigarettes, chewing tobacco, or electronic cigarettes.  If you are pregnant, do not drink alcohol.  If you are breastfeeding, limit how much and how often you drink alcohol.  Limit alcohol intake to no more than 1 drink per day for nonpregnant women. One drink equals 12 ounces of beer, 5 ounces of wine, or 1 ounces of hard liquor.  Do not use street drugs.  Do not share needles.  Ask your health care provider for help if you need support or information about quitting drugs.  Tell your health care provider if you often feel depressed.  Tell your health care provider if you have ever been abused or do not feel safe at home. This information is not intended to replace advice given to you by your health care provider. Make sure you discuss any questions you have with your health care provider. Document Released: 01/12/2011 Document Revised: 12/05/2015 Document Reviewed: 04/02/2015 Elsevier Interactive Patient Education  2018 [place].  Calcium Intake Recommendations Calcium is a mineral that affects many functions in the body, including:  Blood clotting.  Blood vessel function.  Nerve impulse conduction.  Hormone secretion.  Muscle contraction.  Bone and  teeth functions.  Most of your body's calcium supply is stored in your  bones and teeth. When your calcium stores are low, you may be at risk for low bone mass, bone loss, and bone fractures. Consuming enough calcium helps to grow healthy bones and teeth and to prevent breakdown over time. It is very important that you get enough calcium if you are:  A child undergoing rapid growth.  An adolescent girl.  A pre- or post-menopausal woman.  A woman whose menstrual cycle has stopped due to anorexia nervosa or regular intense exercise.  An individual with lactose intolerance or a milk allergy.  A vegetarian.  What is my plan? Try to consume the recommended amount of calcium daily based on your age. Depending on your overall health, your health care provider may recommend increased calcium intake.General daily calcium intake recommendations by age are:  Birth to 6 months: 200 mg.  Infants 7 to 12 months: 260 mg.  Children 1 to 3 years: 700 mg.  Children 4 to 8 years: 1,000 mg.  Children 9 to 13 years: 1,300 mg.  Teens 14 to 18 years: 1,300 mg.  Adults 19 to 50 years: 1,000 mg.  Adult women 51 to 70 years: 1,200 mg.  Adult men 51 to 70 years: 1,000 mg.  Adults 71 years and older: 1,200 mg.  Pregnant and breastfeeding teens: 1,300 mg.  Pregnant and breastfeeding adults: 1,000 mg.  What do I need to know about calcium intake?  In order for the body to absorb calcium, it needs vitamin D. You can get vitamin D through: ? Direct exposure of the skin to sunlight. ? Foods, such as egg yolks, liver, saltwater fish, and fortified milk. ? Supplements.  Consuming too much calcium may cause: ? Constipation. ? Decreased absorption of iron and zinc. ? Kidney stones.  Calcium supplements may interact with certain medicines. Check with your health care provider before starting any calcium supplements.  Try to get most of your calcium from food. What foods can I  eat? Grains  Fortified oatmeal. Fortified ready-to-eat cereals. Fortified frozen waffles. Vegetables Turnip greens. Broccoli. Fruits Fortified orange juice. Meats and Other Protein Sources Canned sardines with bones. Canned salmon with bones. Soy beans. Tofu. Baked beans. Almonds. [place] nuts. Sunflower seeds. Dairy Milk. Yogurt. Cheese. Cottage cheese. Beverages Fortified soy milk. Fortified rice milk. Sweets/Desserts Pudding. Ice Cream. Milkshakes. Blackstrap molasses. The items listed above may not be a complete list of recommended foods or beverages. Contact your dietitian for more options. What foods can affect my calcium intake? It may be more difficult for your body to use calcium or calcium may leave your body more quickly if you consume large amounts of:  Sodium.  Protein.  Caffeine.  Alcohol.  This information is not intended to replace advice given to you by your health care provider. Make sure you discuss any questions you have with your health care provider. Document Released: 02/11/2004 Document Revised: 01/17/2016 Document Reviewed: 12/05/2013 Elsevier Interactive Patient Education  2018 [employer].

## 2017-05-25 LAB — COMPREHENSIVE METABOLIC PANEL
A/G RATIO: 1.8 (ref 1.2–2.2)
ALT: 16 IU/L (ref 0–32)
AST: 21 IU/L (ref 0–40)
Albumin: 4.8 g/dL (ref 3.5–5.5)
Alkaline Phosphatase: 62 IU/L (ref 39–117)
BUN/Creatinine Ratio: 21 (ref 9–23)
BUN: 14 mg/dL (ref 6–24)
Bilirubin Total: 0.6 mg/dL (ref 0.0–1.2)
CALCIUM: 10 mg/dL (ref 8.7–10.2)
CO2: 21 mmol/L (ref 20–29)
CREATININE: 0.66 mg/dL (ref 0.57–1.00)
Chloride: 105 mmol/L (ref 96–106)
GFR calc Af Amer: 118 mL/min/{1.73_m2} (ref >59)
GFR, EST NON AFRICAN AMERICAN: 103 mL/min/{1.73_m2} (ref >59)
GLOBULIN, TOTAL: 2.7 g/dL (ref 1.5–4.5)
Glucose: 84 mg/dL (ref 65–99)
POTASSIUM: 4.3 mmol/L (ref 3.5–5.2)
SODIUM: 145 mmol/L — AB (ref 134–144)
TOTAL PROTEIN: 7.5 g/dL (ref 6.0–8.5)

## 2017-05-25 LAB — CBC WITH DIFFERENTIAL/PLATELET
BASOS: 0 %
Basophils Absolute: 0 10*3/uL (ref 0.0–0.2)
EOS (ABSOLUTE): 0.1 10*3/uL (ref 0.0–0.4)
EOS: 1 %
HEMATOCRIT: 42 % (ref 34.0–46.6)
Hemoglobin: 14.3 g/dL (ref 11.1–15.9)
IMMATURE GRANS (ABS): 0 10*3/uL (ref 0.0–0.1)
IMMATURE GRANULOCYTES: 0 %
LYMPHS: 34 %
Lymphocytes Absolute: 2.3 10*3/uL (ref 0.7–3.1)
MCH: 29.9 pg (ref 26.6–33.0)
MCHC: 34 g/dL (ref 31.5–35.7)
MCV: 88 fL (ref 79–97)
MONOS ABS: 0.4 10*3/uL (ref 0.1–0.9)
Monocytes: 6 %
Neutrophils Absolute: 4 10*3/uL (ref 1.4–7.0)
Neutrophils: 59 %
PLATELETS: 346 10*3/uL (ref 150–379)
RBC: 4.79 x10E6/uL (ref 3.77–5.28)
RDW: 12.6 % (ref 12.3–15.4)
WBC: 6.7 10*3/uL (ref 3.4–10.8)

## 2017-06-22 ENCOUNTER — Ambulatory Visit
Admission: RE | Admit: 2017-06-22 | Discharge: 2017-06-22 | Disposition: A | Discharge status: Home / Self Care | Payer: BLUE CROSS/BLUE SHIELD | Source: Ambulatory Visit | Attending: Family Medicine | Admitting: Family Medicine

## 2017-06-22 DIAGNOSIS — Z1231 Encounter for screening mammogram for malignant neoplasm of breast: Secondary | ICD-10-CM

## 2017-06-22 DIAGNOSIS — Z0000 Encounter for general adult medical examination without abnormal findings: Secondary | ICD-10-CM

## 2017-10-20 ENCOUNTER — Encounter: Payer: Self-pay | Admitting: Physician Assistant

## 2017-10-25 ENCOUNTER — Ambulatory Visit: Payer: BLUE CROSS/BLUE SHIELD | Admitting: Family Medicine

## 2018-02-01 ENCOUNTER — Other Ambulatory Visit: Payer: Self-pay | Admitting: Family Medicine

## 2018-02-03 NOTE — Telephone Encounter (Signed)
Zyrtec 10 mg refill request  LOV 05/24/17 with Dr. Clelia CroftShaw  Last refill:  01/17/17  #30   6 refills  [facility] [phone] [place]

## 2018-03-07 DIAGNOSIS — Z1211 Encounter for screening for malignant neoplasm of colon: Secondary | ICD-10-CM | POA: Diagnosis not present

## 2018-03-22 DIAGNOSIS — Z1211 Encounter for screening for malignant neoplasm of colon: Secondary | ICD-10-CM | POA: Diagnosis not present

## 2018-03-22 LAB — HM COLONOSCOPY

## 2018-04-19 DIAGNOSIS — D2371 Other benign neoplasm of skin of right lower limb, including hip: Secondary | ICD-10-CM | POA: Diagnosis not present

## 2018-04-19 DIAGNOSIS — L820 Inflamed seborrheic keratosis: Secondary | ICD-10-CM | POA: Diagnosis not present

## 2018-05-31 ENCOUNTER — Ambulatory Visit (INDEPENDENT_AMBULATORY_CARE_PROVIDER_SITE_OTHER): Payer: BLUE CROSS/BLUE SHIELD | Admitting: Family Medicine

## 2018-05-31 ENCOUNTER — Encounter: Payer: Self-pay | Admitting: Family Medicine

## 2018-05-31 VITALS — BP 130/90 | HR 99 | Temp 98.3°F | Resp 16 | Ht 62.0 in | Wt 114.6 lb

## 2018-05-31 DIAGNOSIS — J3089 Other allergic rhinitis: Secondary | ICD-10-CM

## 2018-05-31 DIAGNOSIS — Z0000 Encounter for general adult medical examination without abnormal findings: Secondary | ICD-10-CM | POA: Diagnosis not present

## 2018-05-31 DIAGNOSIS — E559 Vitamin D deficiency, unspecified: Secondary | ICD-10-CM

## 2018-05-31 DIAGNOSIS — R3121 Asymptomatic microscopic hematuria: Secondary | ICD-10-CM | POA: Insufficient documentation

## 2018-05-31 DIAGNOSIS — E7800 Pure hypercholesterolemia, unspecified: Secondary | ICD-10-CM

## 2018-05-31 DIAGNOSIS — E782 Mixed hyperlipidemia: Secondary | ICD-10-CM | POA: Insufficient documentation

## 2018-05-31 HISTORY — DX: Asymptomatic microscopic hematuria: R31.21

## 2018-05-31 LAB — POCT URINALYSIS DIP (PROADVANTAGE DEVICE)
BILIRUBIN UA: NEGATIVE
GLUCOSE UA: NEGATIVE mg/dL
Leukocytes, UA: NEGATIVE
Nitrite, UA: NEGATIVE
Protein Ur, POC: NEGATIVE mg/dL
Specific Gravity, Urine: 1.025
Urobilinogen, Ur: NEGATIVE
pH, UA: 6 (ref 5.0–8.0)

## 2018-05-31 MED ORDER — CETIRIZINE HCL 10 MG PO TABS: ORAL_TABLET | ORAL | 6 refills | Status: DC

## 2018-05-31 NOTE — Patient Instructions (Signed)
Return for a nurse visit in 2 weeks to recheck a urine sample.   We will contact you with your lab results.    Preventative Care for Adults - Female      MAINTAIN REGULAR HEALTH EXAMS:  A routine yearly physical is a good way to check in with your primary care provider about your health and preventive screening. It is also an opportunity to share updates about your health and any concerns you have, and receive a thorough all-over exam.   Most health insurance companies pay for at least some preventative services.  Check with your health plan for specific coverages.  WHAT PREVENTATIVE SERVICES DO WOMEN NEED?  Adult women should have their weight and blood pressure checked regularly.   Women age 42 and older should have their cholesterol levels checked regularly.  Women should be screened for cervical cancer with a Pap smear and pelvic exam beginning at age 72.  Breast cancer screening generally begins at age 14 with a mammogram and breast exam by your primary care provider.    Beginning at age 73 and continuing to age 6, women should be screened for colorectal cancer.  Certain people may need continued testing until age 96.  Updating vaccinations is part of preventative care.  Vaccinations help protect against diseases such as the flu.  Osteoporosis is a disease in which the bones lose minerals and strength as we age. Women ages 57 and over should discuss this with their caregivers, as should women after menopause who have other risk factors.  Lab tests are generally done as part of preventative care to screen for anemia and blood disorders, to screen for problems with the kidneys and liver, to screen for bladder problems, to check blood sugar, and to check your cholesterol level.  Preventative services generally include counseling about diet, exercise, avoiding tobacco, drugs, excessive alcohol consumption, and sexually transmitted infections.    GENERAL RECOMMENDATIONS FOR GOOD  HEALTH:  Healthy diet:  Eat a variety of foods, including fruit, vegetables, animal or vegetable protein, such as meat, fish, chicken, and eggs, or beans, lentils, tofu, and grains, such as rice.  Drink plenty of water daily.  Decrease saturated fat in the diet, avoid lots of red meat, processed foods, sweets, fast foods, and fried foods.  Exercise:  Aerobic exercise helps maintain good heart health. At least 30-40 minutes of moderate-intensity exercise is recommended. For example, a brisk walk that increases your heart rate and breathing. This should be done on most days of the week.   Find a type of exercise or a variety of exercises that you enjoy so that it becomes a part of your daily life.  Examples are running, walking, swimming, water aerobics, and biking.  For motivation and support, explore group exercise such as aerobic class, spin class, Zumba, Yoga,or  martial arts, etc.    Set exercise goals for yourself, such as a certain weight goal, walk or run in a race such as a 5k walk/run.  Speak to your primary care provider about exercise goals.  Disease prevention:  If you smoke or chew tobacco, find out from your caregiver how to quit. It can literally save your life, no matter how long you have been a tobacco user. If you do not use tobacco, never begin.   Maintain a healthy diet and normal weight. Increased weight leads to problems with blood pressure and diabetes.   The Body Mass Index or BMI is a way of measuring how much of  your body is fat. Having a BMI above 27 increases the risk of heart disease, diabetes, hypertension, stroke and other problems related to obesity. Your caregiver can help determine your BMI and based on it develop an exercise and dietary program to help you achieve or maintain this important measurement at a healthful level.  High blood pressure causes heart and blood vessel problems.  Persistent high blood pressure should be treated with medicine if weight  loss and exercise do not work.   Fat and cholesterol leaves deposits in your arteries that can block them. This causes heart disease and vessel disease elsewhere in your body.  If your cholesterol is found to be high, or if you have heart disease or certain other medical conditions, then you may need to have your cholesterol monitored frequently and be treated with medication.   Ask if you should have a cardiac stress test if your history suggests this. A stress test is a test done on a treadmill that looks for heart disease. This test can find disease prior to there being a problem.  Menopause can be associated with physical symptoms and risks. Hormone replacement therapy is available to decrease these. You should talk to your caregiver about whether starting or continuing to take hormones is right for you.   Osteoporosis is a disease in which the bones lose minerals and strength as we age. This can result in serious bone fractures. Risk of osteoporosis can be identified using a bone density scan. Women ages 6365 and over should discuss this with their caregivers, as should women after menopause who have other risk factors. Ask your caregiver whether you should be taking a calcium supplement and Vitamin D, to reduce the rate of osteoporosis.   Avoid drinking alcohol in excess (more than two drinks per day).  Avoid use of street drugs. Do not share needles with anyone. Ask for professional help if you need assistance or instructions on stopping the use of alcohol, cigarettes, and/or drugs.  Brush your teeth twice a day with fluoride toothpaste, and floss once a day. Good oral hygiene prevents tooth decay and gum disease. The problems can be painful, unattractive, and can cause other health problems. Visit your dentist for a routine oral and dental check up and preventive care every 6-12 months.   Look at your skin regularly.  Use a mirror to look at your back. Notify your caregivers of changes in moles,  especially if there are changes in shapes, colors, a size larger than a pencil eraser, an irregular border, or development of new moles.  Safety:  Use seatbelts 100% of the time, whether driving or as a passenger.  Use safety devices such as hearing protection if you work in environments with loud noise or significant background noise.  Use safety glasses when doing any work that could send debris in to the eyes.  Use a helmet if you ride a bike or motorcycle.  Use appropriate safety gear for contact sports.  Talk to your caregiver about gun safety.  Use sunscreen with a SPF (or skin protection factor) of 15 or greater.  Lighter skinned people are at a greater risk of skin cancer. Don't forget to also wear sunglasses in order to protect your eyes from too much damaging sunlight. Damaging sunlight can accelerate cataract formation.   Practice safe sex. Use condoms. Condoms are used for birth control and to help reduce the spread of sexually transmitted infections (or STIs).  Some of the STIs are gonorrhea (  the clap), chlamydia, syphilis, trichomonas, herpes, HPV (human papilloma virus) and HIV (human immunodeficiency virus) which causes AIDS. The herpes, HIV and HPV are viral illnesses that have no cure. These can result in disability, cancer and death.   Keep carbon monoxide and smoke detectors in your home functioning at all times. Change the batteries every 6 months or use a model that plugs into the wall.   Vaccinations:  Stay up to date with your tetanus shots and other required immunizations. You should have a booster for tetanus every 10 years. Be sure to get your flu shot every year, since 5%-20% of the [place] population comes down with the flu. The flu vaccine changes each year, so being vaccinated once is not enough. Get your shot in the fall, before the flu season peaks.   Other vaccines to consider:  Human Papilloma Virus or HPV causes cancer of the cervix, and other infections that can be  transmitted from person to person. There is a vaccine for HPV, and females should get immunized between the ages of 29 and 3. It requires a series of 3 shots.   Pneumococcal vaccine to protect against certain types of pneumonia.  This is normally recommended for adults age 19 or older.  However, adults younger than 52 years old with certain underlying conditions such as diabetes, heart or lung disease should also receive the vaccine.  Shingles vaccine to protect against Varicella Zoster if you are older than age 76, or younger than 52 years old with certain underlying illness.  Hepatitis A vaccine to protect against a form of infection of the liver by a virus acquired from food.  Hepatitis B vaccine to protect against a form of infection of the liver by a virus acquired from blood or body fluids, particularly if you work in health care.  If you plan to travel internationally, check with your local health department for specific vaccination recommendations.  Cancer Screening:  Breast cancer screening is essential to preventive care for women. All women age 36 and older should perform a breast self-exam every month. At age 18 and older, women should have their caregiver complete a breast exam each year. Women at ages 57 and older should have a mammogram (x-ray film) of the breasts. Your caregiver can discuss how often you need mammograms.    Cervical cancer screening includes taking a Pap smear (sample of cells examined under a microscope) from the cervix (end of the uterus). It also includes testing for HPV (Human Papilloma Virus, which can cause cervical cancer). Screening and a pelvic exam should begin at age 24, or 3 years after a woman becomes sexually active. Screening should occur every year, with a Pap smear but no HPV testing, up to age 47. After age 88, you should have a Pap smear every 3 years with HPV testing, if no HPV was found previously.   Most routine colon cancer screening begins  at the age of 47. On a yearly basis, doctors may provide special easy to use take-home tests to check for hidden blood in the stool. Sigmoidoscopy or colonoscopy can detect the earliest forms of colon cancer and is life saving. These tests use a small camera at the end of a tube to directly examine the colon. Speak to your caregiver about this at age 89, when routine screening begins (and is repeated every 5 years unless early forms of pre-cancerous polyps or small growths are found).

## 2018-05-31 NOTE — Progress Notes (Signed)
Subjective:    Patient ID: Terri Wall, female    DOB: 05-03-1966, 52 y.o.   MRN: [id number]  HPI Chief Complaint  Patient presents with  . CPE    cpe fasting since 7am   She is new to the practice and here for a complete physical exam. Previous medical care: Dr. Clelia CroftShaw  Last CPE: 2018  Other providers:  Dermatologist  Dr. Elnoria HowardHung- GI   No longer having issues with GERD.  Allergies - takes zyrtec daily.   Denies issues with mood   Social history: Lives with her son and daughter in law , she is a widow, works FT  Denies smoking, drinking alcohol, drug use  No caffeine  Diet: fairly healthy  Excerise: walks around her building at work   Immunizations: UTD   Health maintenance:  Mammogram: 06/2017  Colonoscopy: 03/2018 and due again in 5 years  Last Gynecological Exam: pap smear due in 2020.  Last Menstrual cycle: regular  Dental Exam: twice annually  Last Eye Exam: in the past 3 months.   Wears seatbelt always, uses sunscreen, smoke detectors in home and functioning, does not text while driving and feels safe in home environment.   Reviewed allergies, medications, past medical, surgical, family, and social history.    Review of Systems Review of Systems Constitutional: -fever, -chills, -sweats, -unexpected weight change,-fatigue ENT: -runny nose, -ear pain, -sore throat Cardiology:  -chest pain, -palpitations, -edema Respiratory: -cough, -shortness of breath, -wheezing Gastroenterology: -abdominal pain, -nausea, -vomiting, -diarrhea, -constipation  Hematology: -bleeding or bruising problems Musculoskeletal: -arthralgias, -myalgias, -joint swelling, -back pain Ophthalmology: -vision changes Urology: -dysuria, -difficulty urinating, -hematuria, -urinary frequency, -urgency Neurology: -headache, -weakness, -tingling, -numbness       Objective:   Physical Exam BP 130/90   Pulse 99   Temp 98.3 F (36.8 C) (Oral)   Resp 16   Ht 5\' 2"  (1.575 m)   Wt 114 lb 9.6  oz (52 kg)   LMP 05/03/2018 (Exact Date)   SpO2 97%   BMI 20.96 kg/m   General Appearance:    Alert, cooperative, no distress, appears stated age  Head:    Normocephalic, without obvious abnormality, atraumatic  Eyes:    PERRL, conjunctiva/corneas clear, EOM's intact, fundi    benign  Ears:    Normal TM's and external ear canals  Nose:   Nares normal, mucosa normal, no drainage or sinus   tenderness  Throat:   Lips, mucosa, and tongue normal; teeth and gums normal  Neck:   Supple, no lymphadenopathy;  thyroid:  no   enlargement/tenderness/nodules; no carotid   bruit or JVD  Back:    Spine nontender, no curvature, ROM normal, no CVA     tenderness  Lungs:     Clear to auscultation bilaterally without wheezes, rales or     ronchi; respirations unlabored  Chest Wall:    No tenderness or deformity   Heart:    Regular rate and rhythm, S1 and S2 normal, no murmur, rub   or gallop  Breast Exam:    No tenderness, masses, or nipple discharge or inversion.      No axillary lymphadenopathy  Abdomen:     Soft, non-tender, nondistended, normoactive bowel sounds,    no masses, no hepatosplenomegaly  Genitalia:    Declines. Pap not due.      Extremities:   No clubbing, cyanosis or edema  Pulses:   2+ and symmetric all extremities  Skin:   Skin color, texture, turgor normal, no  rashes or lesions  Lymph nodes:   Cervical, supraclavicular, and axillary nodes normal  Neurologic:   CNII-XII intact, normal strength, sensation and gait; reflexes 2+ and symmetric throughout          Psych:   Normal mood, affect, hygiene and grooming.     Urinalysis dipstick: blood 2+ asymptomatic      Assessment & Plan:  Routine general medical examination at a health care facility - Plan: POCT Urinalysis DIP (Proadvantage Device), CBC with Differential/Platelet, Comprehensive metabolic panel, Lipid panel, TSH, CANCELED: POCT Urinalysis DIP (Proadvantage Device)  Environmental and seasonal allergies - Plan:  cetirizine (ZYRTEC) 10 MG tablet  Vitamin D deficiency - Plan: VITAMIN D 25 Hydroxy (Vit-D Deficiency, Fractures)  Elevated LDL cholesterol level - Plan: Lipid panel  Asymptomatic microscopic hematuria  She is a pleasant 52 year old [place] female who is new to me today.  She was here for a fasting CPE. She appears to be doing well overall. Her main concern today is refilling Zyrtec for daily allergies. She is up-to-date on health maintenance and immunizations. I will request recent colonoscopy from Dr. Elnoria Howard.  Apparently she is due for recall in 5 years. She will be due for Pap smear next year.  She would like to wait the recommended 2 years for repeat mammogram.  She will be due in December 2020 for this. History of vitamin D deficiency and she is currently taking a daily supplement.  Recheck vitamin D level. Apparently she has a history of asymptomatic microscopic hematuria.  She will return to the office in 2 weeks for a nurse visit to recheck urine due to hematuria today. She will also have a recheck of her blood pressure.  It is elevated today and she does not have a history of hypertension. Counseling done on healthy diet and exercise for blood pressure and lipid control. Follow-up pending labs

## 2018-06-01 LAB — COMPREHENSIVE METABOLIC PANEL
A/G RATIO: 1.8 (ref 1.2–2.2)
ALBUMIN: 4.7 g/dL (ref 3.5–5.5)
ALT: 31 IU/L (ref 0–32)
AST: 26 IU/L (ref 0–40)
Alkaline Phosphatase: 71 IU/L (ref 39–117)
BUN/Creatinine Ratio: 19 (ref 9–23)
BUN: 12 mg/dL (ref 6–24)
Bilirubin Total: 0.9 mg/dL (ref 0.0–1.2)
CALCIUM: 9.6 mg/dL (ref 8.7–10.2)
CHLORIDE: 100 mmol/L (ref 96–106)
CO2: 22 mmol/L (ref 20–29)
Creatinine, Ser: 0.63 mg/dL (ref 0.57–1.00)
GFR calc Af Amer: 119 mL/min/{1.73_m2} (ref >59)
GFR calc non Af Amer: 103 mL/min/{1.73_m2} (ref >59)
GLOBULIN, TOTAL: 2.6 g/dL (ref 1.5–4.5)
Glucose: 85 mg/dL (ref 65–99)
POTASSIUM: 4.1 mmol/L (ref 3.5–5.2)
SODIUM: 139 mmol/L (ref 134–144)
Total Protein: 7.3 g/dL (ref 6.0–8.5)

## 2018-06-01 LAB — LIPID PANEL
CHOLESTEROL TOTAL: 233 mg/dL — AB (ref 100–199)
Chol/HDL Ratio: 4.1 ratio (ref 0.0–4.4)
HDL: 57 mg/dL (ref >39)
LDL Calculated: 146 mg/dL — ABNORMAL HIGH (ref 0–99)
Triglycerides: 149 mg/dL (ref 0–149)
VLDL Cholesterol Cal: 30 mg/dL (ref 5–40)

## 2018-06-01 LAB — CBC WITH DIFFERENTIAL/PLATELET
BASOS: 0 %
Basophils Absolute: 0 10*3/uL (ref 0.0–0.2)
EOS (ABSOLUTE): 0.1 10*3/uL (ref 0.0–0.4)
EOS: 1 %
HEMATOCRIT: 40.2 % (ref 34.0–46.6)
HEMOGLOBIN: 13.8 g/dL (ref 11.1–15.9)
Immature Grans (Abs): 0 10*3/uL (ref 0.0–0.1)
Immature Granulocytes: 0 %
LYMPHS ABS: 2.5 10*3/uL (ref 0.7–3.1)
Lymphs: 28 %
MCH: 29.8 pg (ref 26.6–33.0)
MCHC: 34.3 g/dL (ref 31.5–35.7)
MCV: 87 fL (ref 79–97)
MONOS ABS: 0.6 10*3/uL (ref 0.1–0.9)
Monocytes: 7 %
NEUTROS ABS: 5.7 10*3/uL (ref 1.4–7.0)
Neutrophils: 64 %
Platelets: 335 10*3/uL (ref 150–450)
RBC: 4.63 x10E6/uL (ref 3.77–5.28)
RDW: 11.9 % — AB (ref 12.3–15.4)
WBC: 8.9 10*3/uL (ref 3.4–10.8)

## 2018-06-01 LAB — TSH: TSH: 1.38 u[IU]/mL (ref 0.450–4.500)

## 2018-06-01 LAB — VITAMIN D 25 HYDROXY (VIT D DEFICIENCY, FRACTURES): Vit D, 25-Hydroxy: 18.5 ng/mL — ABNORMAL LOW (ref 30.0–100.0)

## 2018-06-16 ENCOUNTER — Encounter: Payer: BLUE CROSS/BLUE SHIELD | Admitting: Family Medicine

## 2018-06-16 ENCOUNTER — Encounter

## 2018-06-28 ENCOUNTER — Encounter: Payer: Self-pay | Admitting: Family Medicine

## 2018-06-28 ENCOUNTER — Ambulatory Visit (INDEPENDENT_AMBULATORY_CARE_PROVIDER_SITE_OTHER): Payer: BLUE CROSS/BLUE SHIELD | Admitting: Family Medicine

## 2018-06-28 VITALS — BP 110/60 | HR 75 | Wt 113.4 lb

## 2018-06-28 DIAGNOSIS — E7800 Pure hypercholesterolemia, unspecified: Secondary | ICD-10-CM

## 2018-06-28 DIAGNOSIS — R319 Hematuria, unspecified: Secondary | ICD-10-CM | POA: Diagnosis not present

## 2018-06-28 DIAGNOSIS — E559 Vitamin D deficiency, unspecified: Secondary | ICD-10-CM

## 2018-06-28 LAB — POCT URINALYSIS DIP (PROADVANTAGE DEVICE)
Bilirubin, UA: NEGATIVE
GLUCOSE UA: NEGATIVE mg/dL
Ketones, POC UA: NEGATIVE mg/dL
Leukocytes, UA: NEGATIVE
NITRITE UA: NEGATIVE
PROTEIN UA: NEGATIVE mg/dL
SPECIFIC GRAVITY, URINE: 1.015
UUROB: NEGATIVE
pH, UA: 6 (ref 5.0–8.0)

## 2018-06-28 NOTE — Patient Instructions (Signed)
Take the vitamin D supplement 1,000 or 2,000 IU daily.  Make sure you are eating a healthy diet and limit fried foods, fatty foods, creamy sauces etc.  Get at least 150 minutes of physical activity per week.   Return fasting in 6 months.    Cholesterol Cholesterol is a white, waxy, fat-like substance that is needed by the human body in small amounts. The liver makes all the cholesterol we need. Cholesterol is carried from the liver by the blood through the blood vessels. Deposits of cholesterol (plaques) may build up on blood vessel (artery) walls. Plaques make the arteries narrower and stiffer. Cholesterol plaques increase the risk for heart attack and stroke. You cannot feel your cholesterol level even if it is very high. The only way to know that it is high is to have a blood test. Once you know your cholesterol levels, you should keep a record of the test results. Work with your health care provider to keep your levels in the desired range. What do the results mean?  Total cholesterol is a rough measure of all the cholesterol in your blood.  LDL (low-density lipoprotein) is the "bad" cholesterol. This is the type that causes plaque to build up on the artery walls. You want this level to be low.  HDL (high-density lipoprotein) is the "good" cholesterol because it cleans the arteries and carries the LDL away. You want this level to be high.  Triglycerides are fat that the body can either burn for energy or store. High levels are closely linked to heart disease. What are the desired levels of cholesterol?  Total cholesterol below 200.  LDL below 100 for people who are at risk, below 70 for people at very high risk.  HDL above 40 is good. A level of 60 or higher is considered to be protective against heart disease.  Triglycerides below 150. How can I lower my cholesterol? Diet Follow your diet program as told by your health care provider.  Choose fish or white meat chicken and  [place], roasted or baked. Limit fatty cuts of red meat, fried foods, and processed meats, such as sausage and lunch meats.  Eat lots of fresh fruits and vegetables.  Choose whole grains, beans, pasta, potatoes, and cereals.  Choose olive oil, corn oil, or canola oil, and use only small amounts.  Avoid butter, mayonnaise, shortening, or palm kernel oils.  Avoid foods with trans fats.  Drink skim or nonfat milk and eat low-fat or nonfat yogurt and cheeses. Avoid whole milk, cream, ice cream, egg yolks, and full-fat cheeses.  Healthier desserts include angel food cake, ginger snaps, animal crackers, hard candy, popsicles, and low-fat or nonfat frozen yogurt. Avoid pastries, cakes, pies, and cookies.  Exercise  Follow your exercise program as told by your health care provider. A regular program: ? Helps to decrease LDL and raise HDL. ? Helps with weight control.  Do things that increase your activity level, such as gardening, walking, and taking the stairs.  Ask your health care provider about ways that you can be more active in your daily life.  Medicine  Take over-the-counter and prescription medicines only as told by your health care provider. ? Medicine may be prescribed by your health care provider to help lower cholesterol and decrease the risk for heart disease. This is usually done if diet and exercise have failed to bring down cholesterol levels. ? If you have several risk factors, you may need medicine even if your levels are normal.  This information is not intended to replace advice given to you by your health care provider. Make sure you discuss any questions you have with your health care provider. Document Released: 03/24/2001 Document Revised: 01/25/2016 Document Reviewed: 12/28/2015 Elsevier Interactive Patient Education  [employer].

## 2018-06-28 NOTE — Progress Notes (Signed)
   Subjective:    Patient ID: Terri Wall, female    DOB: 1965/12/01, 52 y.o.   MRN: [id number]  HPI Chief Complaint  Patient presents with  . follow-up    follow-up on bp and urine   She is here for follow-up on elevated blood pressure at her previous visit and hematuria.  We will also discuss recent abnormal lab values including vitamin D deficiency and elevated LDL.  BP was elevated at her previous visit.  No history of hypertension. She is aware that her blood pressure is normal today.  Since recent labs she reports making healthy changes to her diet and decreasing the amount of fried food she eats.  She does drink full fat milk. Denies family history of hyperlipidemia.  Denies fever, chills, dizziness, chest pain, palpitations, shortness of breath, abdominal pain, back pain, nausea, vomiting, diarrhea, urinary symptoms.   LMP: 06/11/2018   Review of Systems Pertinent positives and negatives in the history of present illness.     Objective:   Physical Exam BP 110/60   Pulse 75   Wt 113 lb 6.4 oz (51.4 kg)   BMI 20.74 kg/m   Alert oriented and in no acute distress.  Not otherwise examined.      Assessment & Plan:  Hematuria, unspecified type - Plan: POCT Urinalysis DIP (Proadvantage Device), Urine Microscopic  Vitamin D deficiency  Elevated LDL cholesterol level  Blood pressure in goal range today.  No further testing or treatment for hypertension. UA shows trace blood. Will send for urine microscopy.  Discussed low vitamin D and treatment.  Counseling on elevated LDL and healthy diet and exercise.  Follow up pending urine microscopy and in 6 months.

## 2018-06-29 ENCOUNTER — Encounter: Payer: Self-pay | Admitting: Internal Medicine

## 2018-06-29 LAB — URINALYSIS, MICROSCOPIC ONLY
Casts: NONE SEEN /lpf
EPITHELIAL CELLS (NON RENAL): NONE SEEN /HPF (ref 0–10)
RBC MICROSCOPIC, UA: NONE SEEN /HPF (ref 0–2)

## 2018-12-20 DIAGNOSIS — Z03818 Encounter for observation for suspected exposure to other biological agents ruled out: Secondary | ICD-10-CM | POA: Diagnosis not present

## 2018-12-20 DIAGNOSIS — Z1159 Encounter for screening for other viral diseases: Secondary | ICD-10-CM | POA: Diagnosis not present

## 2018-12-25 ENCOUNTER — Encounter: Payer: Self-pay | Admitting: Family Medicine

## 2018-12-25 NOTE — Progress Notes (Deleted)
   Subjective:    Patient ID: Terri Wall, female    DOB: 03-18-1966, 53 y.o.   MRN: [id number]  HPI No chief complaint on file.    Review of Systems Pertinent positives and negatives in the history of present illness.     Objective:   Physical Exam There were no vitals taken for this visit.        Assessment & Plan:  Vitamin D deficiency - Plan: ***  Elevated LDL cholesterol level - Plan: ***

## 2018-12-26 ENCOUNTER — Ambulatory Visit: Payer: BLUE CROSS/BLUE SHIELD | Admitting: Family Medicine

## 2019-01-09 ENCOUNTER — Ambulatory Visit (INDEPENDENT_AMBULATORY_CARE_PROVIDER_SITE_OTHER): Payer: BC Managed Care – PPO | Admitting: Family Medicine

## 2019-01-09 ENCOUNTER — Encounter: Payer: Self-pay | Admitting: Family Medicine

## 2019-01-09 ENCOUNTER — Other Ambulatory Visit: Payer: Self-pay

## 2019-01-09 ENCOUNTER — Telehealth: Payer: Self-pay | Admitting: *Deleted

## 2019-01-09 VITALS — BP 120/80 | HR 79 | Temp 98.2°F | Wt 111.6 lb

## 2019-01-09 DIAGNOSIS — E559 Vitamin D deficiency, unspecified: Secondary | ICD-10-CM

## 2019-01-09 DIAGNOSIS — J029 Acute pharyngitis, unspecified: Secondary | ICD-10-CM | POA: Diagnosis not present

## 2019-01-09 DIAGNOSIS — Z20822 Contact with and (suspected) exposure to covid-19: Secondary | ICD-10-CM

## 2019-01-09 DIAGNOSIS — J3089 Other allergic rhinitis: Secondary | ICD-10-CM | POA: Diagnosis not present

## 2019-01-09 DIAGNOSIS — R51 Headache: Secondary | ICD-10-CM

## 2019-01-09 DIAGNOSIS — R519 Headache, unspecified: Secondary | ICD-10-CM

## 2019-01-09 DIAGNOSIS — E7800 Pure hypercholesterolemia, unspecified: Secondary | ICD-10-CM

## 2019-01-09 DIAGNOSIS — R05 Cough: Secondary | ICD-10-CM | POA: Diagnosis not present

## 2019-01-09 DIAGNOSIS — R059 Cough, unspecified: Secondary | ICD-10-CM

## 2019-01-09 MED ORDER — BENZONATATE 200 MG PO CAPS: 200.0000 mg | ORAL_CAPSULE | Freq: Two times a day (BID) | ORAL | 0 refills | Status: DC | PRN

## 2019-01-09 NOTE — Progress Notes (Signed)
   Subjective:    Patient ID: Terri Wall, female    DOB: 01-Mar-1966, 53 y.o.   MRN: [id number]  HPI Chief Complaint  Patient presents with  . 6 month follow-up    fasting follow-up   She is here for a follow up on chronic health conditions. Unfortunately, she is also having acute symptoms that started yesterday.   Complains of a cough, headache and sore throat that started last night. She works in a Company secretary. Possible exposure to Covid-19 based on her job. Her husband is also a Administrator.   Denies fever, chills, body aches, rhinorrhea, nasal congestion, sneezing, shortness of breath, abdominal pain, N/V/D.   Allergies- takes Zyrtec as needed. Did not take it yesterday or today.   Vitamin D deficiency- taking 2,000 IU daily.   Elevated LDL - diet is fairly healthy.   Reviewed allergies, medications, past medical, surgical, family, and social history.     Review of Systems Pertinent positives and negatives in the history of present illness.     Objective:   Physical Exam BP 120/80   Pulse 79   Temp 98.2 F (36.8 C) (Oral)   Wt 111 lb 9.6 oz (50.6 kg)   BMI 20.41 kg/m   Alert and oriented and in no acute distress. Respirations unlabored.  Not otherwise examined. Mask in place.       Assessment & Plan:  Vitamin D deficiency - Plan: continue on current supplement. Did not check labs due to possible Covid-19.   Elevated LDL cholesterol level - Plan: counseling on healthy diet   Environmental and seasonal allergies - Plan: continue with Zyrtec. May add nasal spray if needed.   Cough - Plan: benzonatate (TESSALON) 200 MG capsule, Novel Coronavirus, NAA ([facility]), cough started yesterday. Hydrate, take Mucinex if needed and Tessalon. Quarantine until symptom free and negative Covid-19 test. Work note provided.   Acute nonintractable headache, unspecified headache type - Plan: Novel Coronavirus, NAA ([facility]), take tylenol if needed. Hydrate.   Sore throat - Plan:  Novel Coronavirus, NAA ([facility]), tylenol, hydrate, salt water gargles, Covid-19 testing ordered. Quarantine. Work note given.

## 2019-01-09 NOTE — Patient Instructions (Signed)
You can take Mucinex DM for your cough. Tylenol for headache and sore throat and salt water gargles as well.  You can take the prescription medication benzonate capsules for cough as well.   Hydrate. Do not go to work until symptoms resolve.   Continue on your vitamin D supplement. Eat a low fat, low cholesterol diet.   I will see you back for your annual physical in December. Come in fasting so that we can check your cholesterol. You will be due for pap smear and mammogram as well.

## 2019-01-09 NOTE — Telephone Encounter (Signed)
-----   Message from Koren Bound, [place] sent at 01/09/2019  8:49 AM EDT ----- Patient needs to be tested for coronavirus

## 2019-01-09 NOTE — Telephone Encounter (Signed)
Pt scheduled for covid testing today @[facility]  @ 2:30pm. Instructions given. Order was already placed by Enrigue Catena NP for the covid testing.

## 2019-01-11 ENCOUNTER — Telehealth: Payer: Self-pay | Admitting: Internal Medicine

## 2019-01-11 NOTE — Telephone Encounter (Signed)
Called pt and she states that she went for testing at 2:30pm. She arrived alittle early for her appt but appt desk looks like it was a schedule but not checked in and order is still showing active. I have sent a message to [facility] to find out about status

## 2019-01-12 ENCOUNTER — Other Ambulatory Visit: Payer: Self-pay | Admitting: Internal Medicine

## 2019-01-12 LAB — NOVEL CORONAVIRUS, NAA: SARS-CoV-2, NAA: NOT DETECTED

## 2019-01-12 MED ORDER — SULFAMETHOXAZOLE-TRIMETHOPRIM 800-160 MG PO TABS: 1.0000 | ORAL_TABLET | Freq: Two times a day (BID) | ORAL | 0 refills | Status: DC

## 2019-06-20 ENCOUNTER — Ambulatory Visit (INDEPENDENT_AMBULATORY_CARE_PROVIDER_SITE_OTHER): Payer: BC Managed Care – PPO | Admitting: Family Medicine

## 2019-06-20 ENCOUNTER — Ambulatory Visit
Admission: RE | Admit: 2019-06-20 | Discharge: 2019-06-20 | Disposition: A | Discharge status: Home / Self Care | Payer: BC Managed Care – PPO | Source: Ambulatory Visit | Attending: Family Medicine | Admitting: Family Medicine

## 2019-06-20 ENCOUNTER — Encounter: Payer: Self-pay | Admitting: Family Medicine

## 2019-06-20 ENCOUNTER — Other Ambulatory Visit (HOSPITAL_COMMUNITY)
Admission: RE | Admit: 2019-06-20 | Discharge: 2019-06-20 | Disposition: A | Discharge status: Home / Self Care | Payer: BC Managed Care – PPO | Source: Ambulatory Visit | Attending: Family Medicine | Admitting: Family Medicine

## 2019-06-20 ENCOUNTER — Other Ambulatory Visit: Payer: Self-pay

## 2019-06-20 VITALS — BP 130/70 | HR 80 | Temp 98.0°F | Ht 62.0 in | Wt 112.2 lb

## 2019-06-20 DIAGNOSIS — J3089 Other allergic rhinitis: Secondary | ICD-10-CM

## 2019-06-20 DIAGNOSIS — Z1231 Encounter for screening mammogram for malignant neoplasm of breast: Secondary | ICD-10-CM

## 2019-06-20 DIAGNOSIS — Z124 Encounter for screening for malignant neoplasm of cervix: Secondary | ICD-10-CM

## 2019-06-20 DIAGNOSIS — K219 Gastro-esophageal reflux disease without esophagitis: Secondary | ICD-10-CM

## 2019-06-20 DIAGNOSIS — E559 Vitamin D deficiency, unspecified: Secondary | ICD-10-CM | POA: Diagnosis not present

## 2019-06-20 DIAGNOSIS — Z0000 Encounter for general adult medical examination without abnormal findings: Secondary | ICD-10-CM | POA: Diagnosis not present

## 2019-06-20 DIAGNOSIS — E7800 Pure hypercholesterolemia, unspecified: Secondary | ICD-10-CM

## 2019-06-20 NOTE — Progress Notes (Signed)
Subjective:    Patient ID: Terri Wall, female    DOB: 04-28-1966, 53 y.o.   MRN: [id number]  HPI Chief Complaint  Patient presents with  . fasting cpe    fasting cpe, last pap was 2-3 years ago,     She is here for a complete physical exam. Last CPE: 05/2018  Other providers: Dermatologist  Dr. Elnoria Howard- GI   States she occasionally has acid reflux. States she works late and then eats and goes to bed. She is not currently taking anything for this. She has taken a PPI in the past but stopped because her symptoms improved.   Vitamin D deficiency and is taking a supplement. She thinks she is taking 2,000 IUs daily   Elevated LDL in the past.   Allergies- Zyrtec as needed   States she had vaccine for TB in [place].   Social history: Lives with her son and daughter in law, she is a widow. States she has a long term boyfriend, works FT at a [employer]  Denies smoking, drinking alcohol, drug use  No caffeine  Diet: fairly healthy  Excerise: walks at work   Immunizations: flu shot received [employer]. Tdap UTD.   Health maintenance:  Mammogram: 06/2017 Colonoscopy: 03/2018. Due for recall in 2024 Last Gynecological Exam: pap smear due in 2020  Last Menstrual cycle: 03/2019. irregular.  Last Dental Exam: July 2020 Last Eye Exam: August 2020  Wears seatbelt always, smoke detectors in home and functioning, does not text while driving and feels safe in home environment.   Reviewed allergies, medications, past medical, surgical, family, and social history.   Review of Systems Review of Systems Constitutional: -fever, -chills, -sweats, -unexpected weight change,-fatigue ENT: -runny nose, -ear pain, -sore throat Cardiology:  -chest pain, -palpitations, -edema Respiratory: -cough, -shortness of breath, -wheezing Gastroenterology: -abdominal pain, -nausea, -vomiting, -diarrhea, -constipation  Hematology: -bleeding or bruising problems Musculoskeletal: -arthralgias, -myalgias,  -joint swelling, -back pain Ophthalmology: -vision changes Urology: -dysuria, -difficulty urinating, -hematuria, -urinary frequency, -urgency Neurology: -headache, -weakness, -tingling, -numbness       Objective:   Physical Exam BP 130/70   Pulse 80   Temp 98 F (36.7 C)   Ht 5\' 2"  (1.575 m)   Wt 112 lb 3.2 oz (50.9 kg)   LMP 04/12/2019   BMI 20.52 kg/m   General Appearance:    Alert, cooperative, no distress, appears stated age  Head:    Normocephalic, without obvious abnormality, atraumatic  Eyes:    PERRL, conjunctiva/corneas clear, EOM's intact  Ears:    Normal TM's and external ear canals  Nose:   Mask in place   Throat:   Mask in place   Neck:   Supple, no lymphadenopathy;  thyroid:  no   enlargement/tenderness/nodules; no carotid   bruit or JVD  Back:    Spine nontender, no curvature, ROM normal, no CVA     tenderness  Lungs:     Clear to auscultation bilaterally without wheezes, rales or     ronchi; respirations unlabored  Chest Wall:    No tenderness or deformity   Heart:    Regular rate and rhythm, S1 and S2 normal, no murmur, rub   or gallop  Breast Exam:    No tenderness, masses, or nipple discharge or inversion.      No axillary lymphadenopathy  Abdomen:     Soft, non-tender, nondistended, normoactive bowel sounds,    no masses, no hepatosplenomegaly  Genitalia:    Normal external genitalia without  lesions.  BUS and vagina normal; cervix without lesions, or cervical motion tenderness. No abnormal vaginal discharge.  Uterus and adnexa not enlarged, nontender, no masses.  Pap performed. Chaperone present.      Extremities:   No clubbing, cyanosis or edema  Pulses:   2+ and symmetric all extremities  Skin:   Skin color, texture, turgor normal, no rashes or lesions  Lymph nodes:   Cervical, supraclavicular, and axillary nodes normal  Neurologic:   CNII-XII intact, normal strength, sensation and gait; reflexes 2+ and symmetric throughout          Psych:   Normal  mood, affect, hygiene and grooming.        Assessment & Plan:  Routine general medical examination at a health care facility - Plan: CBC with Differential, Comprehensive metabolic panel, TSH, T4, Free, Lipid  -Here today for fasting CPE.  Preventive healthcare reviewed and updated.  She will call and schedule her mammogram.  Pap smear done in the office and chaperone present.  Colonoscopy appears to be due in 2024.  This was done at Dr. Ulyses Amor office.  Immunizations reviewed.  Up-to-date on Tdap and flu shot.  Discussed safety and health promotion.  Encouraged healthy diet and exercise  Elevated LDL cholesterol level - Plan: Lipid Panel -Discussed healthy diet and exercise to help control this.  Check lipid panel and follow-up  Vitamin D deficiency - Plan: Vitamin D, 25-hydroxy -Continue taking daily supplement.  Check vitamin D level and change dose as appropriate  Environmental and seasonal allergies- Controlled and taking Zyrtec and Flonase as needed.  Encounter for screening mammogram for malignant neoplasm of breast - Plan: MM Digital Screening -She will call the breast center and schedule her mammogram  Screening for cervical cancer - Plan: Cytology - PAP([place]) -Done per guidelines.  Follow-up pending results  Gastroesophageal reflux disease, unspecified whether esophagitis present -recommend she try taking OTC Pepcid as needed. Counseling on avoiding food triggers and do not eat and lay down within 2 hours or elevated the head of her bed if this cannot be avoided.

## 2019-06-20 NOTE — Patient Instructions (Addendum)
For the acid reflux- you can try taking over the counter Pepcid (famotadine) as needed.  Try to avoid eating and laying down and avoid foods that make this worse.   Call and schedule a mammogram and [facility] as discussed.    Cholesterol Content in Foods Cholesterol is a waxy, fat-like substance that helps to carry fat in the blood. The body needs cholesterol in small amounts, but too much cholesterol can cause damage to the arteries and heart. Most people should eat less than 200 milligrams (mg) of cholesterol a day. Foods with cholesterol  Cholesterol is found in animal-based foods, such as meat, seafood, and dairy. Generally, low-fat dairy and lean meats have less cholesterol than full-fat dairy and fatty meats. The milligrams of cholesterol per serving (mg per serving) of common cholesterol-containing foods are listed below. Meat and other proteins  Egg - one large whole egg has 186 mg.  Veal shank - 4 oz has 141 mg.  Lean ground [place] (93% lean) - 4 oz has 118 mg.  Fat-trimmed lamb loin - 4 oz has 106 mg.  Lean ground beef (90% lean) - 4 oz has 100 mg.  Lobster - 3.5 oz has 90 mg.  Pork loin chops - 4 oz has 86 mg.  Canned salmon - 3.5 oz has 83 mg.  Fat-trimmed beef top loin - 4 oz has 78 mg.  Frankfurter - 1 frank (3.5 oz) has 77 mg.  Crab - 3.5 oz has 71 mg.  Roasted chicken without skin, white meat - 4 oz has 66 mg.  Light bologna - 2 oz has 45 mg.  Deli-cut [place] - 2 oz has 31 mg.  Canned tuna - 3.5 oz has 31 mg.  Bacon - 1 oz has 29 mg.  Oysters and mussels (raw) - 3.5 oz has 25 mg.  Mackerel - 1 oz has 22 mg.  Trout - 1 oz has 20 mg.  Pork sausage - 1 link (1 oz) has 17 mg.  Salmon - 1 oz has 16 mg.  Tilapia - 1 oz has 14 mg. Dairy  Soft-serve ice cream -  cup (4 oz) has 103 mg.  Whole-milk yogurt - 1 cup (8 oz) has 29 mg.  Cheddar cheese - 1 oz has 28 mg.  American cheese - 1 oz has 28 mg.  Whole milk - 1 cup (8 oz) has 23 mg.   2% milk - 1 cup (8 oz) has 18 mg.  Cream cheese - 1 tablespoon (Tbsp) has 15 mg.  Cottage cheese -  cup (4 oz) has 14 mg.  Low-fat (1%) milk - 1 cup (8 oz) has 10 mg.  Sour cream - 1 Tbsp has 8.5 mg.  Low-fat yogurt - 1 cup (8 oz) has 8 mg.  Nonfat Greek yogurt - 1 cup (8 oz) has 7 mg.  Half-and-half cream - 1 Tbsp has 5 mg. Fats and oils  Cod liver oil - 1 tablespoon (Tbsp) has 82 mg.  Butter - 1 Tbsp has 15 mg.  Lard - 1 Tbsp has 14 mg.  Bacon grease - 1 Tbsp has 14 mg.  Mayonnaise - 1 Tbsp has 5-10 mg.  Margarine - 1 Tbsp has 3-10 mg. Exact amounts of cholesterol in these foods may vary depending on specific ingredients and brands. Foods without cholesterol Most plant-based foods do not have cholesterol unless you combine them with a food that has cholesterol. Foods without cholesterol include:  Grains and cereals.  Vegetables.  Fruits.  Vegetable oils, such as olive, canola, and sunflower oil.  Legumes, such as peas, beans, and lentils.  Nuts and seeds.  Egg whites. Summary  The body needs cholesterol in small amounts, but too much cholesterol can cause damage to the arteries and heart.  Most people should eat less than 200 milligrams (mg) of cholesterol a day. This information is not intended to replace advice given to you by your health care provider. Make sure you discuss any questions you have with your health care provider. Document Released: 02/23/2017 Document Revised: 06/11/2017 Document Reviewed: 02/23/2017 Elsevier Patient Education  2020 Elsevier Inc.      Preventive Care 78-14 Years Old, Female Preventive care refers to visits with your health care provider and lifestyle choices that can promote health and wellness. This includes:  A yearly physical exam. This may also be called an annual well check.  Regular dental visits and eye exams.  Immunizations.  Screening for certain conditions.  Healthy lifestyle choices, such as  eating a healthy diet, getting regular exercise, not using drugs or products that contain nicotine and tobacco, and limiting alcohol use. What can I expect for my preventive care visit? Physical exam Your health care provider will check your:  Height and weight. This may be used to calculate body mass index (BMI), which tells if you are at a healthy weight.  Heart rate and blood pressure.  Skin for abnormal spots. Counseling Your health care provider may ask you questions about your:  Alcohol, tobacco, and drug use.  Emotional well-being.  Home and relationship well-being.  Sexual activity.  Eating habits.  Work and work Statistician.  Method of birth control.  Menstrual cycle.  Pregnancy history. What immunizations do I need?  Influenza (flu) vaccine  This is recommended every year. Tetanus, diphtheria, and pertussis (Tdap) vaccine  You may need a Td booster every 10 years. Varicella (chickenpox) vaccine  You may need this if you have not been vaccinated. Zoster (shingles) vaccine  You may need this after age 57. Measles, mumps, and rubella (MMR) vaccine  You may need at least one dose of MMR if you were born in 1957 or later. You may also need a second dose. Pneumococcal conjugate (PCV13) vaccine  You may need this if you have certain conditions and were not previously vaccinated. Pneumococcal polysaccharide (PPSV23) vaccine  You may need one or two doses if you smoke cigarettes or if you have certain conditions. Meningococcal conjugate (MenACWY) vaccine  You may need this if you have certain conditions. Hepatitis A vaccine  You may need this if you have certain conditions or if you travel or work in places where you may be exposed to hepatitis A. Hepatitis B vaccine  You may need this if you have certain conditions or if you travel or work in places where you may be exposed to hepatitis B. Haemophilus influenzae type b (Hib) vaccine  You may need this  if you have certain conditions. Human papillomavirus (HPV) vaccine  If recommended by your health care provider, you may need three doses over 6 months. You may receive vaccines as individual doses or as more than one vaccine together in one shot (combination vaccines). Talk with your health care provider about the risks and benefits of combination vaccines. What tests do I need? Blood tests  Lipid and cholesterol levels. These may be checked every 5 years, or more frequently if you are over 58 years old.  Hepatitis C test.  Hepatitis B test. Screening  Lung cancer screening. You may have this screening every year starting at age 28 if you have a 30-pack-year history of smoking and currently smoke or have quit within the past 15 years.  Colorectal cancer screening. All adults should have this screening starting at age 25 and continuing until age 59. Your health care provider may recommend screening at age 36 if you are at increased risk. You will have tests every 1-10 years, depending on your results and the type of screening test.  Diabetes screening. This is done by checking your blood sugar (glucose) after you have not eaten for a while (fasting). You may have this done every 1-3 years.  Mammogram. This may be done every 1-2 years. Talk with your health care provider about when you should start having regular mammograms. This may depend on whether you have a family history of breast cancer.  BRCA-related cancer screening. This may be done if you have a family history of breast, ovarian, tubal, or peritoneal cancers.  Pelvic exam and Pap test. This may be done every 3 years starting at age 76. Starting at age 52, this may be done every 5 years if you have a Pap test in combination with an HPV test. Other tests  Sexually transmitted disease (STD) testing.  Bone density scan. This is done to screen for osteoporosis. You may have this scan if you are at high risk for osteoporosis.  Follow these instructions at home: Eating and drinking  Eat a diet that includes fresh fruits and vegetables, whole grains, lean protein, and low-fat dairy.  Take vitamin and mineral supplements as recommended by your health care provider.  Do not drink alcohol if: ? Your health care provider tells you not to drink. ? You are pregnant, may be pregnant, or are planning to become pregnant.  If you drink alcohol: ? Limit how much you have to 0-1 drink a day. ? Be aware of how much alcohol is in your drink. In the [place] one drink equals one 12 oz bottle of beer (355 mL), one 5 oz glass of wine (148 mL), or one 1 oz glass of hard liquor (44 mL). Lifestyle  Take daily care of your teeth and gums.  Stay active. Exercise for at least 30 minutes on 5 or more days each week.  Do not use any products that contain nicotine or tobacco, such as cigarettes, e-cigarettes, and chewing tobacco. If you need help quitting, ask your health care provider.  If you are sexually active, practice safe sex. Use a condom or other form of birth control (contraception) in order to prevent pregnancy and STIs (sexually transmitted infections).  If told by your health care provider, take low-dose aspirin daily starting at age 51. What's next?  Visit your health care provider once a year for a well check visit.  Ask your health care provider how often you should have your eyes and teeth checked.  Stay up to date on all vaccines. This information is not intended to replace advice given to you by your health care provider. Make sure you discuss any questions you have with your health care provider. Document Released: 07/26/2015 Document Revised: 03/10/2018 Document Reviewed: 03/10/2018 Elsevier Patient Education  2020 [employer].

## 2019-06-21 LAB — LIPID PANEL
Chol/HDL Ratio: 3.8 ratio (ref 0.0–4.4)
Cholesterol, Total: 226 mg/dL — ABNORMAL HIGH (ref 100–199)
HDL: 60 mg/dL (ref >39)
LDL Chol Calc (NIH): 142 mg/dL — ABNORMAL HIGH (ref 0–99)
Triglycerides: 137 mg/dL (ref 0–149)
VLDL Cholesterol Cal: 24 mg/dL (ref 5–40)

## 2019-06-21 LAB — CBC WITH DIFFERENTIAL/PLATELET
Basophils Absolute: 0 10*3/uL (ref 0.0–0.2)
Basos: 0 %
EOS (ABSOLUTE): 0 10*3/uL (ref 0.0–0.4)
Eos: 1 %
Hematocrit: 41.3 % (ref 34.0–46.6)
Hemoglobin: 13.9 g/dL (ref 11.1–15.9)
Immature Grans (Abs): 0 10*3/uL (ref 0.0–0.1)
Immature Granulocytes: 0 %
Lymphocytes Absolute: 2 10*3/uL (ref 0.7–3.1)
Lymphs: 32 %
MCH: 29.9 pg (ref 26.6–33.0)
MCHC: 33.7 g/dL (ref 31.5–35.7)
MCV: 89 fL (ref 79–97)
Monocytes Absolute: 0.4 10*3/uL (ref 0.1–0.9)
Monocytes: 7 %
Neutrophils Absolute: 3.8 10*3/uL (ref 1.4–7.0)
Neutrophils: 60 %
Platelets: 310 10*3/uL (ref 150–450)
RBC: 4.65 x10E6/uL (ref 3.77–5.28)
RDW: 12.4 % (ref 11.7–15.4)
WBC: 6.3 10*3/uL (ref 3.4–10.8)

## 2019-06-21 LAB — COMPREHENSIVE METABOLIC PANEL
ALT: 24 IU/L (ref 0–32)
AST: 16 IU/L (ref 0–40)
Albumin/Globulin Ratio: 1.8 (ref 1.2–2.2)
Albumin: 4.5 g/dL (ref 3.8–4.9)
Alkaline Phosphatase: 70 IU/L (ref 39–117)
BUN/Creatinine Ratio: 21 (ref 9–23)
BUN: 15 mg/dL (ref 6–24)
Bilirubin Total: 1.3 mg/dL — ABNORMAL HIGH (ref 0.0–1.2)
CO2: 23 mmol/L (ref 20–29)
Calcium: 9.2 mg/dL (ref 8.7–10.2)
Chloride: 104 mmol/L (ref 96–106)
Creatinine, Ser: 0.73 mg/dL (ref 0.57–1.00)
GFR calc Af Amer: 109 mL/min/{1.73_m2} (ref >59)
GFR calc non Af Amer: 94 mL/min/{1.73_m2} (ref >59)
Globulin, Total: 2.5 g/dL (ref 1.5–4.5)
Glucose: 88 mg/dL (ref 65–99)
Potassium: 4 mmol/L (ref 3.5–5.2)
Sodium: 140 mmol/L (ref 134–144)
Total Protein: 7 g/dL (ref 6.0–8.5)

## 2019-06-21 LAB — TSH: TSH: 1.88 u[IU]/mL (ref 0.450–4.500)

## 2019-06-21 LAB — VITAMIN D 25 HYDROXY (VIT D DEFICIENCY, FRACTURES): Vit D, 25-Hydroxy: 25.5 ng/mL — ABNORMAL LOW (ref 30.0–100.0)

## 2019-06-21 LAB — T4, FREE: Free T4: 1.24 ng/dL (ref 0.82–1.77)

## 2019-06-22 LAB — CYTOLOGY - PAP
Adequacy: ABSENT
Comment: NEGATIVE
Diagnosis: NEGATIVE
High risk HPV: NEGATIVE

## 2019-11-28 ENCOUNTER — Other Ambulatory Visit: Payer: Self-pay

## 2019-11-28 ENCOUNTER — Ambulatory Visit: Payer: BC Managed Care – PPO | Attending: Internal Medicine

## 2019-11-28 DIAGNOSIS — Z20822 Contact with and (suspected) exposure to covid-19: Secondary | ICD-10-CM

## 2019-11-29 LAB — SARS-COV-2, NAA 2 DAY TAT

## 2019-11-29 LAB — NOVEL CORONAVIRUS, NAA: SARS-CoV-2, NAA: DETECTED — AB

## 2019-12-01 ENCOUNTER — Other Ambulatory Visit: Payer: Self-pay | Admitting: Unknown Physician Specialty

## 2019-12-01 ENCOUNTER — Encounter: Payer: Self-pay | Admitting: Family Medicine

## 2019-12-01 ENCOUNTER — Telehealth (INDEPENDENT_AMBULATORY_CARE_PROVIDER_SITE_OTHER): Payer: BC Managed Care – PPO | Admitting: Family Medicine

## 2019-12-01 ENCOUNTER — Other Ambulatory Visit: Payer: Self-pay

## 2019-12-01 ENCOUNTER — Telehealth: Payer: Self-pay | Admitting: Unknown Physician Specialty

## 2019-12-01 VITALS — Temp 100.3°F | Wt 110.0 lb

## 2019-12-01 DIAGNOSIS — U071 COVID-19: Secondary | ICD-10-CM | POA: Diagnosis not present

## 2019-12-01 DIAGNOSIS — R509 Fever, unspecified: Secondary | ICD-10-CM

## 2019-12-01 NOTE — Progress Notes (Signed)
Pt was approved for mab by Dr. Drue Second as part of a high risk group based on ethnic status

## 2019-12-01 NOTE — Progress Notes (Signed)
   Subjective:  Documentation for virtual audio and video telecommunications through Caregility encounter:  The patient was located at home. 2 patient identifiers used.  The provider was located in the office. The patient did consent to this visit and is aware of possible charges through their insurance for this visit.  The other persons participating in this telemedicine service were her husband.  Time spent on call was 16 minutes and in review of previous records 20 minutes total.  This virtual service is not related to other E/M service within previous 7 days.   Patient ID: Terri Wall, female    DOB: 10-12-1965, 54 y.o.   MRN: [id number]  HPI Chief Complaint  Patient presents with  . Covid positive    Covid Positive- 2 days ago- running nose, some coughing, headache, chills, fever and eye burning. doesn't qualified for infusion .     She was diagnosed with a Covid-19 infection on 11/28/2019. Symptom onset was the same day.  Complains of fever, headache,  rhinorrhea, left eye draining and itching, mild cough and loss of taste.  No anosmia.   States she is eating and drinking plenty of fluids.   Denies body aches, dizziness, ear pain, sore throat, chest pain, palpitations, shortness of breath, abdominal pain, N/V/D.   Reviewed allergies, medications, past medical, surgical, family, and social history.    Review of Systems Pertinent positives and negatives in the history of present illness.      Objective:   Physical Exam Temp 100.3 F (37.9 C)   Wt 110 lb (49.9 kg)   BMI 20.12 kg/m   Alert and oriented and in no acute distress. Respirations unlabored. Speaking in complete sentences without difficulty.       Assessment & Plan:  COVID-19 virus infection - Plan: [facility] COVID-19 HOME MONITORING PROGRAM, Temperature monitoring  Fever due to COVID-19 - Plan: [facility] COVID-19 HOME MONITORING PROGRAM, Temperature monitoring  Discussed symptomatic treatment including  Tylenol, decongestant, and antihistamine. Recommend she stay well hydrated and rest. Discussed quarantining for the next 10 days and follow up with me virtually before being cleared to return to work. I contacted the Covid infusion center and received a response that she is eligible to receive the monoclonal antibody treatment. They will contact her to schedule. [facility] home monitoring was also ordered. She may contact me with questions.

## 2019-12-01 NOTE — Telephone Encounter (Signed)
Husband, who was offered mab infusion, is not interested for himself but wanted to discuss treatment for his wife, Terri Wall about Covid symptoms and the use of bamlanivimab, a monoclonal antibody infusion for those with mild to moderate Covid symptoms and at a high risk of hospitalization.      Pt is not qualified for this infusion due to lack of identified risk factors and co-morbid conditions.  Symptoms reviewed as well as criteria for ending isolation.  Symptoms reviewed that would warrant ED/Hospital evaluation as well should her condition worsen. Preventative practices reviewed. Husband not happy but verbalized understanding.    Patient Active Problem List   Diagnosis Date Noted  . Elevated LDL cholesterol level 05/31/2018  . Adjustment disorder with mixed anxiety and depressed mood 03/22/2015  . GERD (gastroesophageal reflux disease) 12/06/2013  . Environmental and seasonal allergies 12/06/2013  . History of positive PPD, treatment status unknown 12/06/2013  . Vitamin D deficiency 09/20/2011

## 2019-12-01 NOTE — Telephone Encounter (Signed)
  I connected by phone with Terri Wall on 12/01/2019 at 7:12 PM to discuss the potential use of an new treatment for mild to moderate COVID-19 viral infection in non-hospitalized patients.  This patient is a 54 y.o. female that meets the FDA criteria for Emergency Use Authorization of bamlanivimab/etesevimab or casirivimab/imdevimab.  Has a (+) direct SARS-CoV-2 viral test result  Has mild or moderate COVID-19   Is ? 54 years of age and weighs ? 40 kg  Is NOT hospitalized due to COVID-19  Is NOT requiring oxygen therapy or requiring an increase in baseline oxygen flow rate due to COVID-19  Is within 10 days of symptom onset  Has at least one of the high risk factor(s) for progression to severe COVID-19 and/or hospitalization as defined in EUA. Specific high risk criteria Member of a minority group approved by Dr Drue Second   I have spoken and communicated the following to the patient or parent/caregiver:  1. FDA has authorized the emergency use of bamlanivimab/etesevimab and casirivimab\imdevimab for the treatment of mild to moderate COVID-19 in adults and pediatric patients with positive results of direct SARS-CoV-2 viral testing who are 59 years of age and older weighing at least 40 kg, and who are at high risk for progressing to severe COVID-19 and/or hospitalization.  2. The significant known and potential risks and benefits of bamlanivimab/etesevimab and casirivimab\imdevimab, and the extent to which such potential risks and benefits are unknown.  3. Information on available alternative treatments and the risks and benefits of those alternatives, including clinical trials.  4. Patients treated with bamlanivimab/etesevimab and casirivimab\imdevimab should continue to self-isolate and use infection control measures (e.g., wear mask, isolate, social distance, avoid sharing personal items, clean and disinfect "high touch" surfaces, and frequent handwashing) according to CDC guidelines.    5. The patient or parent/caregiver has the option to accept or refuse bamlanivimab/etesevimab or casirivimab\imdevimab .  After reviewing this information with the patient, The patient agreed to proceed with receiving the bamlanimivab infusion and will be provided a copy of the Fact sheet prior to receiving the infusion.Terri Wall 12/01/2019 7:12 PM

## 2019-12-02 MED ORDER — SODIUM CHLORIDE 0.9 % IV SOLN
Freq: Once | INTRAVENOUS | Status: AC
  Filled 2019-12-02: qty 700

## 2019-12-03 ENCOUNTER — Ambulatory Visit (HOSPITAL_COMMUNITY)
Admission: RE | Admit: 2019-12-03 | Discharge: 2019-12-03 | Disposition: A | Discharge status: Home / Self Care | Payer: BC Managed Care – PPO | Source: Ambulatory Visit | Attending: Pulmonary Disease | Admitting: Pulmonary Disease

## 2019-12-03 DIAGNOSIS — U071 COVID-19: Secondary | ICD-10-CM | POA: Insufficient documentation

## 2019-12-03 NOTE — Discharge Instructions (Addendum)

## 2019-12-03 NOTE — Progress Notes (Signed)
  Diagnosis: COVID-19  Physician:Dr Wright  Procedure: [facility] Infusion Clinic Med: bamlanivimab\etesevimab infusion - Provided patient with bamlanimivab\etesevimab fact sheet for patients, parents and caregivers prior to infusion.  Complications: No immediate complications noted.  Discharge: Discharged home   Nadav Swindell Albarece 12/03/2019   

## 2019-12-12 ENCOUNTER — Encounter: Payer: Self-pay | Admitting: Family Medicine

## 2019-12-12 ENCOUNTER — Telehealth (INDEPENDENT_AMBULATORY_CARE_PROVIDER_SITE_OTHER): Payer: BC Managed Care – PPO | Admitting: Family Medicine

## 2019-12-12 ENCOUNTER — Other Ambulatory Visit: Payer: Self-pay

## 2019-12-12 VITALS — Temp 98.7°F | Wt 105.0 lb

## 2019-12-12 DIAGNOSIS — U071 COVID-19: Secondary | ICD-10-CM

## 2019-12-12 DIAGNOSIS — Z8616 Personal history of COVID-19: Secondary | ICD-10-CM

## 2019-12-12 HISTORY — DX: Personal history of COVID-19: Z86.16

## 2019-12-12 NOTE — Progress Notes (Signed)
° °  Subjective:  Documentation for virtual audio and video telecommunications through Caregility encounter:  The patient was located at home. 2 patient identifiers used.  The provider was located in the office. The patient did consent to this visit and is aware of possible charges through their insurance for this visit.  The other persons participating in this telemedicine service were none. Time spent on call was 15 minutes and in review of previous records 20 minutes total.  This virtual service is not related to other E/M service within previous 7 days.   Patient ID: Terri Wall, female    DOB: Oct 30, 1965, 54 y.o.   MRN: [id number]  HPI Chief Complaint  Patient presents with   follow-up on covid    follow-up on covid,doing better. no symptoms   This is a follow up on Covid infection.  States she is 100% back to normal.  She received the monoclonal antibody infusion on 12/03/2019.  No fever for one week and has not needed an antipyretic.  She would like to go back to work tomorrow.  States she received a call from the [facility] department 2 days ago that said she no longer needed to quarantine.  Denies fever, chills, headache, chest pain, palpitations, shortness of breath, cough, nausea, vomiting, diarrhea. Reports eating well and sleeping well.  No new concerns.   Review of Systems  Pertinent positives and negatives in the history of present illness.     Objective:   Physical Exam Temp 98.7 F (37.1 C)    Wt 105 lb (47.6 kg)    BMI 19.20 kg/m   Alert and oriented in no acute distress.  Respirations unlabored.  Normal speech, mood and thought process.       Assessment & Plan:  COVID-19 virus infection  She seems to be back to baseline.  I see no reason why she cannot return to work tomorrow.  Her boss asked her if to take a repeat Covid test and I explained that we do not do this.  I will provide her with a letter stating that she may return to work  tomorrow.  Discussed waiting 90 days until she receives the Covid vaccine.  Follow-up as needed

## 2020-06-10 ENCOUNTER — Other Ambulatory Visit: Payer: Self-pay | Admitting: Family Medicine

## 2020-06-10 DIAGNOSIS — Z1231 Encounter for screening mammogram for malignant neoplasm of breast: Secondary | ICD-10-CM

## 2020-07-01 ENCOUNTER — Encounter: Payer: BC Managed Care – PPO | Admitting: Family Medicine

## 2020-07-08 ENCOUNTER — Other Ambulatory Visit: Payer: Self-pay

## 2020-07-08 ENCOUNTER — Ambulatory Visit (INDEPENDENT_AMBULATORY_CARE_PROVIDER_SITE_OTHER): Payer: BC Managed Care – PPO | Admitting: Family Medicine

## 2020-07-08 ENCOUNTER — Encounter: Payer: Self-pay | Admitting: Family Medicine

## 2020-07-08 VITALS — BP 130/72 | HR 62 | Temp 97.6°F | Ht 61.5 in | Wt 112.4 lb

## 2020-07-08 DIAGNOSIS — Z0000 Encounter for general adult medical examination without abnormal findings: Secondary | ICD-10-CM

## 2020-07-08 DIAGNOSIS — Z1159 Encounter for screening for other viral diseases: Secondary | ICD-10-CM

## 2020-07-08 DIAGNOSIS — Z23 Encounter for immunization: Secondary | ICD-10-CM | POA: Diagnosis not present

## 2020-07-08 DIAGNOSIS — E559 Vitamin D deficiency, unspecified: Secondary | ICD-10-CM

## 2020-07-08 DIAGNOSIS — Z1329 Encounter for screening for other suspected endocrine disorder: Secondary | ICD-10-CM | POA: Diagnosis not present

## 2020-07-08 DIAGNOSIS — E7800 Pure hypercholesterolemia, unspecified: Secondary | ICD-10-CM | POA: Diagnosis not present

## 2020-07-08 DIAGNOSIS — Z2821 Immunization not carried out because of patient refusal: Secondary | ICD-10-CM

## 2020-07-08 NOTE — Progress Notes (Signed)
Subjective:    Patient ID: Terri Wall, female    DOB: 1966-02-11, 54 y.o.   MRN: [id number]  HPI Chief Complaint  Patient presents with  . Annual Exam    CPE no other issues wants to get flu shot today   She is here for a complete physical exam.  Other providers: GI - Dr. Elnoria Howard  Therapist- Mariane Masters   Vitamin D def- is taking a supplement  LDL elevated.   Hx of Covid and is not vaccinated.   Social history: Lives alone, widowed 2015, 1 son and 3 grandchildren, works as a Radio broadcast assistant Denies smoking, drinking alcohol, drug use  Diet: fairly healthy  Excerise: none   Immunizations: flu shot today. Declines Covid vaccines   Health maintenance:  Mammogram: 06/2019 Colonoscopy: 2 years ago. Dr. Elnoria Howard. Due in 3 years per patient.  Last Gynecological Exam: 06/2019 Last Menstrual cycle: October 2021  Last Dental Exam: 2 months ago  Last Eye Exam: August 2021 and cataracts   Wears seatbelt always, smoke detectors in home and functioning, does not text while driving and feels safe in home environment.   Reviewed allergies, medications, past medical, surgical, family, and social history.   Review of Systems Review of Systems Constitutional: -fever, -chills, -sweats, -unexpected weight change,-fatigue ENT: -runny nose, -ear pain, -sore throat Cardiology:  -chest pain, -palpitations, -edema Respiratory: -cough, -shortness of breath, -wheezing Gastroenterology: -abdominal pain, -nausea, -vomiting, -diarrhea, -constipation  Hematology: -bleeding or bruising problems Musculoskeletal: -arthralgias, -myalgias, -joint swelling, -back pain Ophthalmology: -vision changes Urology: -dysuria, -difficulty urinating, -hematuria, -urinary frequency, -urgency Neurology: -headache, -weakness, -tingling, -numbness       Objective:   Physical Exam BP 130/72   Pulse 62   Temp 97.6 F (36.4 C)   Ht 5' 1.5" (1.562 m)   Wt 112 lb 6.4 oz (51 kg)   BMI 20.89 kg/m   General  Appearance:    Alert, cooperative, no distress, appears stated age  Head:    Normocephalic, without obvious abnormality, atraumatic  Eyes:    PERRL, conjunctiva/corneas clear, EOM's intact  Ears:    Normal TM's and external ear canals  Nose:   Mask on   Throat:   Mask on   Neck:   Supple, no lymphadenopathy;  thyroid:  no   enlargement/tenderness/nodules; no JVD  Back:    Spine nontender, no curvature, ROM normal, no CVA     tenderness  Lungs:     Clear to auscultation bilaterally without wheezes, rales or     ronchi; respirations unlabored  Chest Wall:    No tenderness or deformity   Heart:    Regular rate and rhythm, S1 and S2 normal, no murmur, rub   or gallop  Breast Exam:    Declines   Abdomen:     Soft, non-tender, nondistended, normoactive bowel sounds,    no masses, no hepatosplenomegaly  Genitalia:    Declines      Extremities:   No clubbing, cyanosis or edema  Pulses:   2+ and symmetric all extremities  Skin:   Skin color, texture, turgor normal, no rashes or lesions  Lymph nodes:   Cervical, supraclavicular, and axillary nodes normal  Neurologic:   CNII-XII intact, normal strength, sensation and gait; reflexes 2+ and symmetric throughout          Psych:   Normal mood, affect, hygiene and grooming.        Assessment & Plan:  Routine general medical examination at a health care facility -  Plan: CBC with Differential/Platelet, Comprehensive metabolic panel, TSH, T4, free -She is here for fasting CPE.  Preventive health care reviewed.  She will call and schedule her mammogram.  Pap smear up-to-date.  Colonoscopy up-to-date and I will request her most recent colonoscopy from Dr. Haywood Pao office.  Counseling on healthy lifestyle including diet and exercise.  Immunizations reviewed.  Discussed safety and health promotion.  Recommend regular dental and eye exams.  Elevated LDL cholesterol level - Plan: Lipid panel -Counseling on low-fat, low-cholesterol diet and increasing physical  activity.  Follow-up pending lipid results  Vitamin D deficiency - Plan: VITAMIN D 25 Hydroxy (Vit-D Deficiency, Fractures) -She is currently taking a supplement.  Check vitamin D level and follow-up.  Adjust dose as appropriate  Needs flu shot - Plan: Flu Vaccine QUAD 36+ mos IM  COVID-19 vaccination declined -She is aware that she could get a different strain of Covid but still declines vaccines  Need for hepatitis C screening test - Plan: Hepatitis C antibody -Done per screening guidelines  Screening for thyroid disorder - Plan: TSH, T4, free

## 2020-07-08 NOTE — Patient Instructions (Addendum)
Preventive Care 56-54 Years Old, Female Preventive care refers to visits with your health care provider and lifestyle choices that can promote health and wellness. This includes:  A yearly physical exam. This may also be called an annual well check.  Regular dental visits and eye exams.  Immunizations.  Screening for certain conditions.  Healthy lifestyle choices, such as eating a healthy diet, getting regular exercise, not using drugs or products that contain nicotine and tobacco, and limiting alcohol use. What can I expect for my preventive care visit? Physical exam Your health care provider will check your:  Height and weight. This may be used to calculate body mass index (BMI), which tells if you are at a healthy weight.  Heart rate and blood pressure.  Skin for abnormal spots. Counseling Your health care provider may ask you questions about your:  Alcohol, tobacco, and drug use.  Emotional well-being.  Home and relationship well-being.  Sexual activity.  Eating habits.  Work and work Statistician.  Method of birth control.  Menstrual cycle.  Pregnancy history. What immunizations do I need?  Influenza (flu) vaccine  This is recommended every year. Tetanus, diphtheria, and pertussis (Tdap) vaccine  You may need a Td booster every 10 years. Varicella (chickenpox) vaccine  You may need this if you have not been vaccinated. Zoster (shingles) vaccine  You may need this after age 46. Measles, mumps, and rubella (MMR) vaccine  You may need at least one dose of MMR if you were born in 1957 or later. You may also need a second dose. Pneumococcal conjugate (PCV13) vaccine  You may need this if you have certain conditions and were not previously vaccinated. Pneumococcal polysaccharide (PPSV23) vaccine  You may need one or two doses if you smoke cigarettes or if you have certain conditions. Meningococcal conjugate (MenACWY) vaccine  You may need this if  you have certain conditions. Hepatitis A vaccine  You may need this if you have certain conditions or if you travel or work in places where you may be exposed to hepatitis A. Hepatitis B vaccine  You may need this if you have certain conditions or if you travel or work in places where you may be exposed to hepatitis B. Haemophilus influenzae type b (Hib) vaccine  You may need this if you have certain conditions. Human papillomavirus (HPV) vaccine  If recommended by your health care provider, you may need three doses over 6 months. You may receive vaccines as individual doses or as more than one vaccine together in one shot (combination vaccines). Talk with your health care provider about the risks and benefits of combination vaccines. What tests do I need? Blood tests  Lipid and cholesterol levels. These may be checked every 5 years, or more frequently if you are over 62 years old.  Hepatitis C test.  Hepatitis B test. Screening  Lung cancer screening. You may have this screening every year starting at age 8 if you have a 30-pack-year history of smoking and currently smoke or have quit within the past 15 years.  Colorectal cancer screening. All adults should have this screening starting at age 30 and continuing until age 97. Your health care provider may recommend screening at age 66 if you are at increased risk. You will have tests every 1-10 years, depending on your results and the type of screening test.  Diabetes screening. This is done by checking your blood sugar (glucose) after you have not eaten for a while (fasting). You may have  this done every 1-3 years.  Mammogram. This may be done every 1-2 years. Talk with your health care provider about when you should start having regular mammograms. This may depend on whether you have a family history of breast cancer.  BRCA-related cancer screening. This may be done if you have a family history of breast, ovarian, tubal, or  peritoneal cancers.  Pelvic exam and Pap test. This may be done every 3 years starting at age 21. Starting at age 30, this may be done every 5 years if you have a Pap test in combination with an HPV test. Other tests  Sexually transmitted disease (STD) testing.  Bone density scan. This is done to screen for osteoporosis. You may have this scan if you are at high risk for osteoporosis. Follow these instructions at home: Eating and drinking  Eat a diet that includes fresh fruits and vegetables, whole grains, lean protein, and low-fat dairy.  Take vitamin and mineral supplements as recommended by your health care provider.  Do not drink alcohol if: ? Your health care provider tells you not to drink. ? You are pregnant, may be pregnant, or are planning to become pregnant.  If you drink alcohol: ? Limit how much you have to 0-1 drink a day. ? Be aware of how much alcohol is in your drink. In the [place] one drink equals one 12 oz bottle of beer (355 mL), one 5 oz glass of wine (148 mL), or one 1 oz glass of hard liquor (44 mL). Lifestyle  Take daily care of your teeth and gums.  Stay active. Exercise for at least 30 minutes on 5 or more days each week.  Do not use any products that contain nicotine or tobacco, such as cigarettes, e-cigarettes, and chewing tobacco. If you need help quitting, ask your health care provider.  If you are sexually active, practice safe sex. Use a condom or other form of birth control (contraception) in order to prevent pregnancy and STIs (sexually transmitted infections).  If told by your health care provider, take low-dose aspirin daily starting at age 50. What's next?  Visit your health care provider once a year for a well check visit.  Ask your health care provider how often you should have your eyes and teeth checked.  Stay up to date on all vaccines. This information is not intended to replace advice given to you by your health care provider. Make  sure you discuss any questions you have with your health care provider. Document Revised: 03/10/2018 Document Reviewed: 03/10/2018 Elsevier Patient Education  2020 [employer]vier Inc.  

## 2020-07-09 LAB — LIPID PANEL
Chol/HDL Ratio: 4.3 ratio (ref 0.0–4.4)
Cholesterol, Total: 254 mg/dL — ABNORMAL HIGH (ref 100–199)
HDL: 59 mg/dL (ref >39)
LDL Chol Calc (NIH): 158 mg/dL — ABNORMAL HIGH (ref 0–99)
Triglycerides: 203 mg/dL — ABNORMAL HIGH (ref 0–149)
VLDL Cholesterol Cal: 37 mg/dL (ref 5–40)

## 2020-07-09 LAB — COMPREHENSIVE METABOLIC PANEL
ALT: 28 IU/L (ref 0–32)
AST: 21 IU/L (ref 0–40)
Albumin/Globulin Ratio: 1.9 (ref 1.2–2.2)
Albumin: 4.8 g/dL (ref 3.8–4.9)
Alkaline Phosphatase: 81 IU/L (ref 44–121)
BUN/Creatinine Ratio: 23 (ref 9–23)
BUN: 15 mg/dL (ref 6–24)
Bilirubin Total: 0.8 mg/dL (ref 0.0–1.2)
CO2: 21 mmol/L (ref 20–29)
Calcium: 9.9 mg/dL (ref 8.7–10.2)
Chloride: 104 mmol/L (ref 96–106)
Creatinine, Ser: 0.65 mg/dL (ref 0.57–1.00)
GFR calc Af Amer: 116 mL/min/{1.73_m2} (ref >59)
GFR calc non Af Amer: 101 mL/min/{1.73_m2} (ref >59)
Globulin, Total: 2.5 g/dL (ref 1.5–4.5)
Glucose: 88 mg/dL (ref 65–99)
Potassium: 3.8 mmol/L (ref 3.5–5.2)
Sodium: 140 mmol/L (ref 134–144)
Total Protein: 7.3 g/dL (ref 6.0–8.5)

## 2020-07-09 LAB — T4, FREE: Free T4: 1.16 ng/dL (ref 0.82–1.77)

## 2020-07-09 LAB — CBC WITH DIFFERENTIAL/PLATELET
Basophils Absolute: 0 10*3/uL (ref 0.0–0.2)
Basos: 0 %
EOS (ABSOLUTE): 0.1 10*3/uL (ref 0.0–0.4)
Eos: 1 %
Hematocrit: 40.6 % (ref 34.0–46.6)
Hemoglobin: 14 g/dL (ref 11.1–15.9)
Immature Grans (Abs): 0 10*3/uL (ref 0.0–0.1)
Immature Granulocytes: 0 %
Lymphocytes Absolute: 2.5 10*3/uL (ref 0.7–3.1)
Lymphs: 35 %
MCH: 29.9 pg (ref 26.6–33.0)
MCHC: 34.5 g/dL (ref 31.5–35.7)
MCV: 87 fL (ref 79–97)
Monocytes Absolute: 0.4 10*3/uL (ref 0.1–0.9)
Monocytes: 5 %
Neutrophils Absolute: 4.3 10*3/uL (ref 1.4–7.0)
Neutrophils: 59 %
Platelets: 287 10*3/uL (ref 150–450)
RBC: 4.68 x10E6/uL (ref 3.77–5.28)
RDW: 12.2 % (ref 11.7–15.4)
WBC: 7.3 10*3/uL (ref 3.4–10.8)

## 2020-07-09 LAB — TSH: TSH: 1.4 u[IU]/mL (ref 0.450–4.500)

## 2020-07-09 LAB — HEPATITIS C ANTIBODY: Hep C Virus Ab: <0.1 s/co ratio (ref 0.0–0.9)

## 2020-07-09 LAB — VITAMIN D 25 HYDROXY (VIT D DEFICIENCY, FRACTURES): Vit D, 25-Hydroxy: 34.3 ng/mL (ref 30.0–100.0)

## 2021-01-06 ENCOUNTER — Ambulatory Visit
Admission: RE | Admit: 2021-01-06 | Discharge: 2021-01-06 | Disposition: A | Discharge status: Home / Self Care | Payer: 59 | Source: Ambulatory Visit | Attending: Family Medicine | Admitting: Family Medicine

## 2021-01-06 ENCOUNTER — Other Ambulatory Visit: Payer: Self-pay

## 2021-01-06 DIAGNOSIS — Z1231 Encounter for screening mammogram for malignant neoplasm of breast: Secondary | ICD-10-CM

## 2021-01-09 ENCOUNTER — Other Ambulatory Visit: Payer: Self-pay | Admitting: Family Medicine

## 2021-01-09 DIAGNOSIS — R928 Other abnormal and inconclusive findings on diagnostic imaging of breast: Secondary | ICD-10-CM

## 2021-01-15 ENCOUNTER — Telehealth: Payer: Self-pay | Admitting: Family Medicine

## 2021-01-15 ENCOUNTER — Encounter: Payer: Self-pay | Admitting: Internal Medicine

## 2021-01-15 NOTE — Telephone Encounter (Signed)
Pt came in and dropped off colonoscopy report from Dr. Elnoria Howard.

## 2021-02-25 ENCOUNTER — Ambulatory Visit: Payer: 59

## 2021-02-25 ENCOUNTER — Other Ambulatory Visit: Payer: Self-pay

## 2021-02-25 ENCOUNTER — Ambulatory Visit
Admission: RE | Admit: 2021-02-25 | Discharge: 2021-02-25 | Disposition: A | Discharge status: Home / Self Care | Payer: 59 | Source: Ambulatory Visit | Attending: Family Medicine | Admitting: Family Medicine

## 2021-02-25 DIAGNOSIS — R928 Other abnormal and inconclusive findings on diagnostic imaging of breast: Secondary | ICD-10-CM

## 2021-04-16 NOTE — Progress Notes (Deleted)
   Subjective:    Patient ID: Terri Wall, female    DOB: 08-Aug-1965, 55 y.o.   MRN: [id number]  HPI No chief complaint on file.  She is  here for a complete physical exam.  Other providers:   Social history: Lives with ***, works as ***, *** Smoking, drinking alcohol, drug use  Diet: *** Excerise: ***  Immunizations:  Health maintenance:   Mammogram: Colonoscopy: Last Gynecological Exam: Last Menstrual cycle: Pregnancies:  Last Dental Exam: Last Eye Exam:  Wears seatbelt always, uses sunscreen, smoke detectors in home and functioning, does not text while driving and feels safe in home environment.   Reviewed allergies, medications, past medical, surgical, family, and social history.    Review of Systems Review of Systems Constitutional: -fever, -chills, -sweats, -unexpected weight change,-fatigue ENT: -runny nose, -ear pain, -sore throat Cardiology:  -chest pain, -palpitations, -edema Respiratory: -cough, -shortness of breath, -wheezing Gastroenterology: -abdominal pain, -nausea, -vomiting, -diarrhea, -constipation  Hematology: -bleeding or bruising problems Musculoskeletal: -arthralgias, -myalgias, -joint swelling, -back pain Ophthalmology: -vision changes Urology: -dysuria, -difficulty urinating, -hematuria, -urinary frequency, -urgency Neurology: -headache, -weakness, -tingling, -numbness       Objective:   Physical Exam There were no vitals taken for this visit.  General Appearance:    Alert, cooperative, no distress, appears stated age  Head:    Normocephalic, without obvious abnormality, atraumatic  Eyes:    PERRL, conjunctiva/corneas clear, EOM's intact, fundi    benign  Ears:    Normal TM's and external ear canals  Nose:   Nares normal, mucosa normal, no drainage or sinus   tenderness  Throat:   Lips, mucosa, and tongue normal; teeth and gums normal  Neck:   Supple, no lymphadenopathy;  thyroid:  no   enlargement/tenderness/nodules; no carotid    bruit or JVD  Back:    Spine nontender, no curvature, ROM normal, no CVA     tenderness  Lungs:     Clear to auscultation bilaterally without wheezes, rales or     ronchi; respirations unlabored  Chest Wall:    No tenderness or deformity   Heart:    Regular rate and rhythm, S1 and S2 normal, no murmur, rub   or gallop  Breast Exam:    No tenderness, masses, or nipple discharge or inversion.      No axillary lymphadenopathy  Abdomen:     Soft, non-tender, nondistended, normoactive bowel sounds,    no masses, no hepatosplenomegaly  Genitalia:    Normal external genitalia without lesions.  BUS and vagina normal; cervix without lesions, or cervical motion tenderness. No abnormal vaginal discharge.  Uterus and adnexa not enlarged, nontender, no masses.  Pap performed  Rectal:    Normal tone, no masses or tenderness; guaiac negative stool  Extremities:   No clubbing, cyanosis or edema  Pulses:   2+ and symmetric all extremities  Skin:   Skin color, texture, turgor normal, no rashes or lesions  Lymph nodes:   Cervical, supraclavicular, and axillary nodes normal  Neurologic:   CNII-XII intact, normal strength, sensation and gait; reflexes 2+ and symmetric throughout          Psych:   Normal mood, affect, hygiene and grooming.          Assessment & Plan:  Routine general medical examination at a health care facility  Elevated LDL cholesterol level

## 2021-04-17 ENCOUNTER — Encounter: Payer: 59 | Admitting: Family Medicine

## 2021-04-17 DIAGNOSIS — E7800 Pure hypercholesterolemia, unspecified: Secondary | ICD-10-CM

## 2021-04-17 DIAGNOSIS — Z0000 Encounter for general adult medical examination without abnormal findings: Secondary | ICD-10-CM

## 2021-06-30 ENCOUNTER — Encounter: Payer: BC Managed Care – PPO | Admitting: Family Medicine

## 2021-09-02 ENCOUNTER — Telehealth: Payer: 59 | Admitting: Physician Assistant

## 2021-09-02 ENCOUNTER — Other Ambulatory Visit: Payer: Self-pay

## 2021-09-02 ENCOUNTER — Encounter: Payer: Self-pay | Admitting: Physician Assistant

## 2021-09-02 VITALS — Temp 98.7°F | Ht 62.0 in | Wt 110.0 lb

## 2021-09-02 DIAGNOSIS — J069 Acute upper respiratory infection, unspecified: Secondary | ICD-10-CM | POA: Diagnosis not present

## 2021-09-02 MED ORDER — BENZONATATE 100 MG PO CAPS: 200.0000 mg | ORAL_CAPSULE | Freq: Three times a day (TID) | ORAL | 0 refills | Status: DC | PRN

## 2021-09-02 NOTE — Progress Notes (Signed)
° °  Virtual Visit via Video Note   Patient ID: Terri Wall, female    DOB: 12-11-1965, 56 y.o.   MRN: [id number]  I connected with above patient on 09/02/21 by a video enabled telemedicine application and verified that I am speaking with the correct person using two identifiers.  Location: Patient: home Provider: office   I discussed the limitations of evaluation and management by telemedicine and the availability of in person appointments. The patient expressed understanding and agreed to proceed.  History of Present Illness:  Chief Complaint  Patient presents with   Cough    VIRTUAL cough,runny nose and swollen face. Symptoms started a few days ago. Home covid test last night was negative. No fever.    Patient reports a 2 day history of cough and runny nose; reports having a cough with clear phlegm, denies fever today, denies chills, nausea, vomiting, diarrhea, or constipation; denies body aches but has mild low back pain; denies recent travel; denies sick contacts; denies allergy symptoms.   Observations/Objective:  Temp 98.7 F (37.1 C) (Oral)    Ht 5\' 2"  (1.575 m)    Wt 110 lb (49.9 kg)    BMI 20.12 kg/m    Assessment: Encounter Diagnosis  Name Primary?   Viral upper respiratory tract infection Yes     Plan: URI - rest, increase clear fluids, continue OTC cold medicine, tessalon perles prescribed, if worse, go to Urgent Care of Emergency Department for evaluation; negative COVID test at home; patient declined to come to the office for an influenza swab  [name] was seen today for cough.  Diagnoses and all orders for this visit:  Viral upper respiratory tract infection -     benzonatate (TESSALON PERLES) 100 MG capsule; Take 2 capsules (200 mg total) by mouth 3 (three) times daily as needed for cough.    Follow up:    I discussed the assessment and treatment plan with the patient. The patient was provided an opportunity to ask questions and all were answered. The  patient agreed with the plan and demonstrated an understanding of the instructions.   The patient was advised to call back or seek an in-person evaluation if the symptoms worsen or if the condition fails to improve as anticipated.  I spent 15 minutes dedicated to the care of this patient, including pre-visit review of records, face to face time, post-visit ordering of testing and documentation.    Irene Pap, PA-C

## 2021-09-11 ENCOUNTER — Telehealth: Payer: Self-pay | Admitting: Physician Assistant

## 2021-09-11 NOTE — Telephone Encounter (Signed)
Pt. Called back and I let her know that he could do an in person f/u with you or f/u at Urgent Care pt. Stated she had to work tomorrow. She stated that she will continue her medicine and if not better she will go to urgent care.  ?

## 2021-09-11 NOTE — Telephone Encounter (Signed)
Sounds good. Thanks 

## 2021-09-11 NOTE — Telephone Encounter (Signed)
Left detailed message for patient letting her know she should either follow up at [facility] or she can call back and schedule in person appointment with Orlie Pollen. ?

## 2021-09-11 NOTE — Telephone Encounter (Signed)
Terri Wall - Please call patient to request that she go to Urgent Care for further evaluation OR she can come in the office to see me. She will need an in person office visit to determine is she has a sinus infection, allergic rhinitis, COVID, etc

## 2021-09-11 NOTE — Telephone Encounter (Signed)
Pt called and states that she continues to have issues. She states that she has a lot of pressure in forehead and under eyes. She states that her nose continues to run. She thinks she may have a sinus infection. Pt can be reached at [phone]. Pt has a NEW PHARMACY, [facility] on [address] ?

## 2021-09-22 DIAGNOSIS — R1013 Epigastric pain: Secondary | ICD-10-CM | POA: Diagnosis not present

## 2021-09-22 DIAGNOSIS — K219 Gastro-esophageal reflux disease without esophagitis: Secondary | ICD-10-CM | POA: Diagnosis not present

## 2021-09-22 DIAGNOSIS — R1110 Vomiting, unspecified: Secondary | ICD-10-CM | POA: Diagnosis not present

## 2021-10-13 NOTE — Progress Notes (Signed)
Eight ? ? ?Complete physical exam ? ? ?Patient: Terri Wall   DOB: 01/26/1966   56 y.o. Female  MRN: [id number] ?Visit Date: 10/14/2021 ? ?Chief Complaint  ?Patient presents with  ? Annual Exam  ?  Fasting CPE- Allergies are bad in the mornings. She said her eyes are stuck together.  ? ?Subjective  ?  ?Terri Wall is a 56 y.o. female who presents today for a complete physical exam.  ? ?Reports is generally feeling well; is eating a healthy diet; is sleeping well 8 - 9 hours a night; drinks 1 bottle of water a day; is exercising with walking about 2 days a week while on break at work. ? ? ?HPI ?HPI   ? ? Annual Exam   ? Additional comments: Fasting CPE- Allergies are bad in the mornings. She said her eyes are stuck together. ? ?  ?  ?Last edited by Sammuel Cooper, CMA on 10/14/2021  9:33 AM.  ?  ?  ? ? ?Past Medical History:  ?Diagnosis Date  ? Allergy   ? Anxiety   ? Asymptomatic microscopic hematuria 05/31/2018  ? GERD (gastroesophageal reflux disease) 12/06/2013  ? History of COVID-19 12/12/2019  ? History of positive PPD, treatment status unknown   ? Reflux   ? ?No past surgical history on file. ?Social History  ? ?Socioeconomic History  ? Marital status: Widowed  ?  Spouse name: Not on file  ? Number of children: Not on file  ? Years of education: Not on file  ? Highest education level: Not on file  ?Occupational History  ? Not on file  ?Tobacco Use  ? Smoking status: Never  ? Smokeless tobacco: Never  ?Vaping Use  ? Vaping Use: Never used  ?Substance and Sexual Activity  ? Alcohol use: No  ? Drug use: No  ? Sexual activity: Yes  ?  Partners: Male  ?  Birth control/protection: None  ?Other Topics Concern  ? Not on file  ?Social History Narrative  ? Not on file  ? ?Social Determinants of Health  ? ?Financial Resource Strain: Not on file  ?Food Insecurity: Not on file  ?Transportation Needs: Not on file  ?Physical Activity: Not on file  ?Stress: Not on file  ?Social Connections: Not on file  ?Intimate Partner Violence: Not  on file  ? ?Family Status  ?Relation Name Status  ? Mother  Alive  ? Father  Deceased  ? Sister  Alive  ? Brother  Alive  ? Brother  Alive  ? Sister  Alive  ? ?No family history on file. ?Allergies  ?Allergen Reactions  ? Doxycycline Hives  ? Penicillins   ?  Pharmacist reports hives, and trouble breathing  ? Tetracyclines & Related Hives  ?  ?Patient Care Team: ?Lexine Baton as PCP - General (Physician Assistant) ?Jeani Hawking, MD as Consulting Physician (Gastroenterology)  ? ?Medications: ?Outpatient Medications Prior to Visit  ?Medication Sig  ? cholecalciferol (VITAMIN D3) 25 MCG (1000 UNIT) tablet Take 1,000 Units by mouth daily.  ? Multiple Vitamins-Minerals (MULTIVITAMIN WITH MINERALS) tablet Take 1 tablet by mouth daily. Reported on 09/30/2015  ? omeprazole (PRILOSEC) 40 MG capsule Take 40 mg by mouth daily.  ? fluticasone (FLONASE) 50 MCG/ACT nasal spray Place 2 sprays into both nostrils daily. (Patient not taking: Reported on 12/12/2019)  ? [DISCONTINUED] benzonatate (TESSALON PERLES) 100 MG capsule Take 2 capsules (200 mg total) by mouth 3 (three) times daily as needed for cough. (Patient not taking:  Reported on 10/14/2021)  ? [DISCONTINUED] cetirizine (ZYRTEC) 10 MG tablet TAKE 1 TABLET(10 MG) BY MOUTH AT BEDTIME (Patient not taking: Reported on 12/12/2019)  ? [DISCONTINUED] Pseudoeph-Doxylamine-DM-APAP (NYQUIL PO) Take 2 capsules by mouth at bedtime. (Patient not taking: Reported on 10/14/2021)  ? [DISCONTINUED] Pseudoephedrine-APAP-DM (DAYQUIL PO) Take 2 capsules by mouth every 4 (four) hours. (Patient not taking: Reported on 10/14/2021)  ? ?No facility-administered medications prior to visit.  ? ? ?Review of Systems  ?Constitutional:  Negative for activity change and fever.  ?HENT:  Negative for congestion, ear pain and voice change.   ?Eyes:  Negative for redness.  ?Respiratory:  Negative for cough.   ?Cardiovascular:  Negative for chest pain.  ?Gastrointestinal:  Negative for constipation and  diarrhea.  ?Endocrine: Negative for polyuria.  ?Genitourinary:  Negative for flank pain.  ?Musculoskeletal:  Negative for gait problem and neck stiffness.  ?Skin:  Negative for color change and rash.  ?Neurological:  Negative for dizziness.  ?Hematological:  Negative for adenopathy.  ?Psychiatric/Behavioral:  Negative for agitation, behavioral problems and confusion.   ? ?Last CBC ?Lab Results  ?Component Value Date  ? WBC 7.3 07/08/2020  ? HGB 14.0 07/08/2020  ? HCT 40.6 07/08/2020  ? MCV 87 07/08/2020  ? MCH 29.9 07/08/2020  ? RDW 12.2 07/08/2020  ? PLT 287 07/08/2020  ? ?Last metabolic panel ?Lab Results  ?Component Value Date  ? GLUCOSE 88 07/08/2020  ? NA 140 07/08/2020  ? K 3.8 07/08/2020  ? CL 104 07/08/2020  ? CO2 21 07/08/2020  ? BUN 15 07/08/2020  ? CREATININE 0.65 07/08/2020  ? GFRNONAA 101 07/08/2020  ? CALCIUM 9.9 07/08/2020  ? PROT 7.3 07/08/2020  ? ALBUMIN 4.8 07/08/2020  ? LABGLOB 2.5 07/08/2020  ? AGRATIO 1.9 07/08/2020  ? BILITOT 0.8 07/08/2020  ? ALKPHOS 81 07/08/2020  ? AST 21 07/08/2020  ? ALT 28 07/08/2020  ? ANIONGAP 9 02/25/2015  ? ?Last lipids ?Lab Results  ?Component Value Date  ? CHOL 254 (H) 07/08/2020  ? HDL 59 07/08/2020  ? LDLCALC 158 (H) 07/08/2020  ? TRIG 203 (H) 07/08/2020  ? CHOLHDL 4.3 07/08/2020  ? ?Last hemoglobin A1c ?Lab Results  ?Component Value Date  ? HGBA1C 5.2 04/15/2015  ? ?Last thyroid functions ?Lab Results  ?Component Value Date  ? TSH 1.400 07/08/2020  ? ?  ? ?The 10-year ASCVD risk score (Arnett DK, et al., 2019) is: 4.3% ? ? Objective  ?  ?BP 110/60   Pulse 73   Ht 5\' 1"  (1.549 m)   Wt 105 lb 12.8 oz (48 kg)   SpO2 99%   BMI 19.99 kg/m?  ? ?  ? ? ?Physical Exam ?Vitals and nursing note reviewed.  ?Constitutional:   ?   General: She is not in acute distress. ?   Appearance: Normal appearance. She is not ill-appearing.  ?HENT:  ?   Head: Normocephalic and atraumatic.  ?   Right Ear: Tympanic membrane, ear canal and external ear normal.  ?   Left Ear: Tympanic  membrane, ear canal and external ear normal.  ?   Nose: No congestion.  ?Eyes:  ?   Extraocular Movements: Extraocular movements intact.  ?   Conjunctiva/sclera: Conjunctivae normal.  ?   Pupils: Pupils are equal, round, and reactive to light.  ?Neck:  ?   Vascular: No carotid bruit.  ?Cardiovascular:  ?   Rate and Rhythm: Normal rate and regular rhythm.  ?   Pulses: Normal pulses.  ?  Heart sounds: Normal heart sounds.  ?Pulmonary:  ?   Effort: Pulmonary effort is normal.  ?   Breath sounds: Normal breath sounds. No wheezing.  ?Abdominal:  ?   General: Bowel sounds are normal.  ?   Palpations: Abdomen is soft.  ?Musculoskeletal:     ?   General: Normal range of motion.  ?   Cervical back: Normal range of motion and neck supple.  ?   Right lower leg: No edema.  ?   Left lower leg: No edema.  ?Skin: ?   General: Skin is warm and dry.  ?   Findings: No bruising.  ?Neurological:  ?   General: No focal deficit present.  ?   Mental Status: She is alert and oriented to person, place, and time.  ?Psychiatric:     ?   Mood and Affect: Mood normal.     ?   Behavior: Behavior normal.     ?   Thought Content: Thought content normal.  ?  ? ? ?Last depression screening scores ? ?  10/14/2021  ?  9:37 AM 07/08/2020  ?  2:20 PM 06/20/2019  ?  9:19 AM  ?PHQ 2/9 Scores  ?PHQ - 2 Score 0 0 0  ? ?Last fall risk screening ? ?  10/14/2021  ?  9:37 AM  ?Fall Risk   ?Falls in the past year? 0  ?Number falls in past yr: 0  ?Injury with Fall? 0  ?Risk for fall due to : No Fall Risks  ?Follow up Falls evaluation completed  ? ? ? ?No results found for any visits on 10/14/21. ? Assessment & Plan  ?  ?Routine Health Maintenance and Physical Exam ? ?Exercise Activities and Dietary recommendations ? Goals   ?None ?  ? ? ?Immunization History  ?Administered Date(s) Administered  ? Influenza Inj Mdck Quad Pf 05/02/2019  ? Influenza Split 05/02/2012  ? Influenza,inj,Quad PF,6+ Mos 04/25/2013, 04/20/2014, 04/15/2015, 04/21/2016, 05/24/2017, 05/14/2018,  07/08/2020  ? Tdap 07/14/2007, 05/24/2017  ? ? ?Health Maintenance  ?Topic Date Due  ? COVID-19 Vaccine (1) 10/30/2021 (Originally 08/26/1966)  ? Zoster Vaccines- Shingrix (1 of 2) 01/13/2022 (Originally 02/24/2016)

## 2021-10-14 ENCOUNTER — Ambulatory Visit: Payer: 59 | Admitting: Physician Assistant

## 2021-10-14 ENCOUNTER — Encounter: Payer: Self-pay | Admitting: Physician Assistant

## 2021-10-14 VITALS — BP 110/60 | HR 73 | Ht 61.0 in | Wt 105.8 lb

## 2021-10-14 DIAGNOSIS — Z681 Body mass index (BMI) 19 or less, adult: Secondary | ICD-10-CM | POA: Diagnosis not present

## 2021-10-14 DIAGNOSIS — J3089 Other allergic rhinitis: Secondary | ICD-10-CM | POA: Diagnosis not present

## 2021-10-14 DIAGNOSIS — E782 Mixed hyperlipidemia: Secondary | ICD-10-CM

## 2021-10-14 DIAGNOSIS — Z0000 Encounter for general adult medical examination without abnormal findings: Secondary | ICD-10-CM | POA: Diagnosis not present

## 2021-10-14 DIAGNOSIS — K219 Gastro-esophageal reflux disease without esophagitis: Secondary | ICD-10-CM | POA: Diagnosis not present

## 2021-10-14 HISTORY — DX: Body mass index (BMI) 19.9 or less, adult: Z68.1

## 2021-10-14 MED ORDER — CETIRIZINE HCL 10 MG PO TABS: ORAL_TABLET | ORAL | 6 refills | Status: DC

## 2021-10-14 NOTE — Assessment & Plan Note (Signed)
Stable, OTC antihistamine, nasal steroid spray, and expectorant as needed; if worse can refer to Allergist for further evaluation and allergy testing ? ?

## 2021-10-14 NOTE — Assessment & Plan Note (Signed)
Stable, continue current managent ?

## 2021-10-14 NOTE — Assessment & Plan Note (Signed)
stable, continue Prilosec 40 mg qd, avoid stomach irritants (tomatoes, oranges, lemons, limes, spicy/greasy food), followed by GI Dr. Carol Ada ? ?

## 2021-10-14 NOTE — Patient Instructions (Signed)
You can take an OTC expectorant like guaifenesin or Mucinexto help decrease head and nasal congestion.  ? ?You can take an OTC decongestant like Sudafed or phenylephrine to help dry up nasal congestion. Do Not take this medicine if you have high blood pressure or heart palpitations. You can go to a store with a pharmacy and ask them to help you find these medicines. ? ?For nasal congestion and post nasal drip you can use an OTC nasal saline rinse as well as one of the OTC nasal steroids (generic equivalent) like Flonase (Fluticasone) or Rhinocort (Budesonide) or Nasacort (Triamcinolone) or Nasonex (Mometasone Furoate).  ? ?You can take an over the counter antihistamine to help with allergic rhinitis / itching / hives: NON-DROWSY Allegra (Fexofenadine) 180 mg daily or NON-Drowsy Claritin (Loratidine) 10 mg daily  or DROWSY Benadryl (Diphenhydramine) 25 mg as directed or Zyrtec (Cetirizine) 10 mg daily. You can go to a store with a pharmacy and ask them to help you find these medicines. ? ?Preventative Care for Adults - Female ?   ?  MAINTAIN REGULAR HEALTH EXAMS: ?A routine yearly physical is a good way to check in with your primary care provider about your health and preventive screening. It is also an opportunity to share updates about your health and any concerns you have, and receive a thorough all-over exam.  ?Most health insurance companies pay for at least some preventative services.  Check with your health plan for specific coverages. ? ?WHAT PREVENTATIVE SERVICES DO WOMEN NEED? ?Adult women should have their weight and blood pressure checked regularly.  ?Women age 56 and older should have their cholesterol levels checked regularly. ?Women should be screened for cervical cancer with a Pap smear and pelvic exam beginning at either age 56, or 3 years after they become sexually activity.   ?Breast cancer screening generally begins at age 56 with a mammogram and breast exam by your primary care provider.    ?Beginning at age 56 and continuing to age 56, women should be screened for colorectal cancer.  Certain people may need continued testing until age 16. ?Updating vaccinations is part of preventative care.  Vaccinations help protect against diseases such as the flu. ?Osteoporosis is a disease in which the bones lose minerals and strength as we age. Women ages 56 and over should discuss this with their caregivers, as should women after menopause who have other risk factors. ?Lab tests are generally done as part of preventative care to screen for anemia and blood disorders, to screen for problems with the kidneys and liver, to screen for bladder problems, to check blood sugar, and to check your cholesterol level. ?Preventative services generally include counseling about diet, exercise, avoiding tobacco, drugs, excessive alcohol consumption, and sexually transmitted infections.   ? ?GENERAL RECOMMENDATIONS FOR GOOD HEALTH: ? ?Healthy diet: ?Eat a variety of foods, including fruit, vegetables, animal or vegetable protein, such as meat, fish, chicken, and eggs, or beans, lentils, tofu, and grains, such as rice. ?Drink plenty of water daily (60 - 80 ounces or 8 - 10 glasses a day) ?Decrease saturated fat in the diet, avoid lots of red meat, processed foods, sweets, fast foods, and fried foods. For high cholesterol - Increase fiber intake (Benefiber or Metamucil, Cherrios,  oatmeal, beans, nuts, fruits and vegetables), limit saturated fats (in fried foods, red meat), can add OTC fish oil supplement, eat fish with Omega-3 fatty acids like salmon and tuna, exercise for 30 minutes 3 - 5 times a week, drink 8 -  10 glasses of water a day ? ?Exercise: ?Aerobic exercise helps maintain good heart health. At least 30-40 minutes of moderate-intensity exercise is recommended. For example, a brisk walk that increases your heart rate and breathing. This should be done on most days of the week.  ?Find a type of exercise or a variety of  exercises that you enjoy so that it becomes a part of your daily life.  Examples are running, walking, swimming, water aerobics, and biking.  For motivation and support, explore group exercise such as aerobic class, spin class, Zumba, Yoga,or  martial arts, etc.   ?Set exercise goals for yourself, such as a certain weight goal, walk or run in a race such as a 5k walk/run.  Speak to your primary care provider about exercise goals. ? ?Disease prevention: ?If you smoke or chew tobacco, find out from your caregiver how to quit. It can literally save your life, no matter how long you have been a tobacco user. If you do not use tobacco, never begin.  ?Maintain a healthy diet and normal weight. Increased weight leads to problems with blood pressure and diabetes.  ?The Body Mass Index or BMI is a way of measuring how much of your body is fat. Having a BMI above 27 increases the risk of heart disease, diabetes, hypertension, stroke and other problems related to obesity. Your caregiver can help determine your BMI and based on it develop an exercise and dietary program to help you achieve or maintain this important measurement at a healthful level. ?High blood pressure causes heart and blood vessel problems.  Persistent high blood pressure should be treated with medicine if weight loss and exercise do not work.  ?Fat and cholesterol leaves deposits in your arteries that can block them. This causes heart disease and vessel disease elsewhere in your body.  If your cholesterol is found to be high, or if you have heart disease or certain other medical conditions, then you may need to have your cholesterol monitored frequently and be treated with medication.  ?Ask if you should have a cardiac stress test if your history suggests this. A stress test is a test done on a treadmill that looks for heart disease. This test can find disease prior to there being a problem. ?Menopause can be associated with physical symptoms and risks.  Hormone replacement therapy is available to decrease these. You should talk to your caregiver about whether starting or continuing to take hormones is right for you.  ?Osteoporosis is a disease in which the bones lose minerals and strength as we age. This can result in serious bone fractures. Risk of osteoporosis can be identified using a bone density scan. Women ages 65 and over should discuss this with their caregivers, as should women after menopause who have other risk factors. Ask your caregiver whether you should be taking a calcium supplement and Vitamin D, to reduce the rate of osteoporosis.  ?Avoid drinking alcohol in excess (more than two drinks per day).  Avoid use of street drugs. Do not share needles with anyone. Ask for professional help if you need assistance or instructions on stopping the use of alcohol, cigarettes, and/or drugs. ?Brush your teeth twice a day with fluoride toothpaste, and floss once a day. Good oral hygiene prevents tooth decay and gum disease. The problems can be painful, unattractive, and can cause other health problems. Visit your dentist for a routine oral and dental check up and preventive care every 6-12 months.  ?Look at your  skin regularly.  Use a mirror to look at your back. Notify your caregivers of changes in moles, especially if there are changes in shapes, colors, a size larger than a pencil eraser, an irregular border, or development of new moles. ? ?Safety: ?Use seatbelts 100% of the time, whether driving or as a passenger.  Use safety devices such as hearing protection if you work in environments with loud noise or significant background noise.  Use safety glasses when doing any work that could send debris in to the eyes.  Use a helmet if you ride a bike or motorcycle.  Use appropriate safety gear for contact sports.  Talk to your caregiver about gun safety. ?Use sunscreen with a SPF (or skin protection factor) of 15 or greater.  Lighter skinned people are at a  greater risk of skin cancer. Don?t forget to also wear sunglasses in order to protect your eyes from too much damaging sunlight. Damaging sunlight can accelerate cataract formation.  ?Practice safe sex. Use condoms.

## 2021-10-14 NOTE — Assessment & Plan Note (Signed)
controlled, eat a low fat diet, increase fiber intake (Benefiber or Metamucil, Cherrios,  oatmeal, beans, nuts, fruits and vegetables), limit saturated fats (in fried foods, red meat), can add OTC fish oil supplement, eat fish with Omega-3 fatty acids like salmon and tuna, exercise for 30 minutes 3 - 5 times a week, drink 8 - 10 glasses of water a day ? ? ?

## 2021-10-15 LAB — COMPREHENSIVE METABOLIC PANEL
ALT: 17 IU/L (ref 0–32)
AST: 19 IU/L (ref 0–40)
Albumin/Globulin Ratio: 1.7 (ref 1.2–2.2)
Albumin: 4.5 g/dL (ref 3.8–4.9)
Alkaline Phosphatase: 96 IU/L (ref 44–121)
BUN/Creatinine Ratio: 18 (ref 9–23)
BUN: 12 mg/dL (ref 6–24)
Bilirubin Total: 0.8 mg/dL (ref 0.0–1.2)
CO2: 25 mmol/L (ref 20–29)
Calcium: 9.9 mg/dL (ref 8.7–10.2)
Chloride: 102 mmol/L (ref 96–106)
Creatinine, Ser: 0.67 mg/dL (ref 0.57–1.00)
Globulin, Total: 2.6 g/dL (ref 1.5–4.5)
Glucose: 94 mg/dL (ref 70–99)
Potassium: 4.5 mmol/L (ref 3.5–5.2)
Sodium: 139 mmol/L (ref 134–144)
Total Protein: 7.1 g/dL (ref 6.0–8.5)
eGFR: 103 mL/min/{1.73_m2} (ref >59)

## 2021-10-15 LAB — CBC WITH DIFFERENTIAL/PLATELET
Basophils Absolute: 0 10*3/uL (ref 0.0–0.2)
Basos: 1 %
EOS (ABSOLUTE): 0.1 10*3/uL (ref 0.0–0.4)
Eos: 1 %
Hematocrit: 41.7 % (ref 34.0–46.6)
Hemoglobin: 13.8 g/dL (ref 11.1–15.9)
Immature Grans (Abs): 0 10*3/uL (ref 0.0–0.1)
Immature Granulocytes: 0 %
Lymphocytes Absolute: 2 10*3/uL (ref 0.7–3.1)
Lymphs: 35 %
MCH: 29.2 pg (ref 26.6–33.0)
MCHC: 33.1 g/dL (ref 31.5–35.7)
MCV: 88 fL (ref 79–97)
Monocytes Absolute: 0.4 10*3/uL (ref 0.1–0.9)
Monocytes: 6 %
Neutrophils Absolute: 3.2 10*3/uL (ref 1.4–7.0)
Neutrophils: 57 %
Platelets: 309 10*3/uL (ref 150–450)
RBC: 4.72 x10E6/uL (ref 3.77–5.28)
RDW: 12.4 % (ref 11.7–15.4)
WBC: 5.6 10*3/uL (ref 3.4–10.8)

## 2021-10-15 LAB — LIPID PANEL
Chol/HDL Ratio: 5 ratio — ABNORMAL HIGH (ref 0.0–4.4)
Cholesterol, Total: 222 mg/dL — ABNORMAL HIGH (ref 100–199)
HDL: 44 mg/dL (ref >39)
LDL Chol Calc (NIH): 113 mg/dL — ABNORMAL HIGH (ref 0–99)
Triglycerides: 378 mg/dL — ABNORMAL HIGH (ref 0–149)
VLDL Cholesterol Cal: 65 mg/dL — ABNORMAL HIGH (ref 5–40)

## 2021-10-20 DIAGNOSIS — K219 Gastro-esophageal reflux disease without esophagitis: Secondary | ICD-10-CM | POA: Diagnosis not present

## 2022-01-09 ENCOUNTER — Encounter: Payer: Self-pay | Admitting: Internal Medicine

## 2022-02-23 ENCOUNTER — Other Ambulatory Visit: Payer: Self-pay | Admitting: Physician Assistant

## 2022-02-23 DIAGNOSIS — Z1231 Encounter for screening mammogram for malignant neoplasm of breast: Secondary | ICD-10-CM

## 2022-03-17 ENCOUNTER — Ambulatory Visit: Payer: Self-pay

## 2022-03-18 ENCOUNTER — Encounter: Payer: Self-pay | Admitting: Internal Medicine

## 2022-03-23 ENCOUNTER — Ambulatory Visit
Admission: RE | Admit: 2022-03-23 | Discharge: 2022-03-23 | Disposition: A | Discharge status: Home / Self Care | Payer: 59 | Source: Ambulatory Visit | Attending: Physician Assistant | Admitting: Physician Assistant

## 2022-03-23 DIAGNOSIS — Z1231 Encounter for screening mammogram for malignant neoplasm of breast: Secondary | ICD-10-CM | POA: Diagnosis not present

## 2022-04-21 ENCOUNTER — Encounter: Payer: Self-pay | Admitting: Internal Medicine

## 2022-05-26 ENCOUNTER — Encounter: Payer: Self-pay | Admitting: Internal Medicine

## 2022-09-28 DIAGNOSIS — Z1211 Encounter for screening for malignant neoplasm of colon: Secondary | ICD-10-CM | POA: Diagnosis not present

## 2022-09-28 DIAGNOSIS — K219 Gastro-esophageal reflux disease without esophagitis: Secondary | ICD-10-CM | POA: Diagnosis not present

## 2022-10-19 ENCOUNTER — Encounter: Payer: Self-pay | Admitting: Nurse Practitioner

## 2022-10-19 ENCOUNTER — Ambulatory Visit: Payer: 59 | Admitting: Nurse Practitioner

## 2022-10-19 VITALS — BP 122/76 | HR 77 | Ht 62.0 in | Wt 113.6 lb

## 2022-10-19 DIAGNOSIS — Z23 Encounter for immunization: Secondary | ICD-10-CM | POA: Diagnosis not present

## 2022-10-19 DIAGNOSIS — K219 Gastro-esophageal reflux disease without esophagitis: Secondary | ICD-10-CM

## 2022-10-19 DIAGNOSIS — J3089 Other allergic rhinitis: Secondary | ICD-10-CM | POA: Diagnosis not present

## 2022-10-19 DIAGNOSIS — E782 Mixed hyperlipidemia: Secondary | ICD-10-CM | POA: Diagnosis not present

## 2022-10-19 DIAGNOSIS — E559 Vitamin D deficiency, unspecified: Secondary | ICD-10-CM

## 2022-10-19 DIAGNOSIS — Z0000 Encounter for general adult medical examination without abnormal findings: Secondary | ICD-10-CM | POA: Diagnosis not present

## 2022-10-19 HISTORY — DX: Encounter for general adult medical examination without abnormal findings: Z00.00

## 2022-10-19 NOTE — Assessment & Plan Note (Signed)
We will repeat labs today. She is not currently taking a supplement, but has in the past.

## 2022-10-19 NOTE — Assessment & Plan Note (Signed)
She has seasonal allergy symptoms of itchy, watery eyes with no rhinorrhea or sinus congestion. At this time her symptoms are well controlled. She is unable to take oral medications due to increased sleepiness as a side effect. She can consider Pataday eye drops to see if this is helpful for management given that the majority of her symptoms are related to the eyes. She will follow-up if she has any new or worsening symptoms.

## 2022-10-19 NOTE — Progress Notes (Signed)
[name], DNP, AGNP-c [facility] [address] [place], [place] [address] Main Office [phone]  BP 122/76   Pulse 77   Ht 5\' 2"  (1.575 m)   Wt 113 lb 9.6 oz (51.5 kg)   BMI 20.78 kg/m    Subjective:    Patient ID: Terri Wall Terri Wall, female    DOB: 12-22-1965, 57 y.o.   MRN: [id number]  HPI: Terri Wall Curt is a 57 y.o. female presenting on 10/19/2022 for comprehensive medical examination.   Current medical concerns include: Payge reports good health at this time. She does not have any acute concerns. She tells me that she has some seasonal allergy issues with itchy eyes, but she prefers not to take medication as this can make her sleepy.  She is followed closely with GI for GERD. Colonoscopy is scheduled for September with Dr. Elnoria HowardHung.     ROS negative except for what is listed in HPI.  IMMUNIZATIONS:   Flu: Flu vaccine completed elsewhere this season Prevnar 13: Prevnar 13 N/A for this patient Prevnar 20: Prevnar 20 N/A for this patient Pneumovax 23: Pneumovax 23 N/A for this patient Vac Shingrix: Shingrix declined, patient will complete at a later date- we will schedule today.  HPV: HPV N/A for this patient Tetanus: Tetanus completed in the last 10 years  HEALTH MAINTENANCE: Pap Smear HM Status: is up to date Mammogram HM Status: is up to date Colon Cancer Screening HM Status: scheduled for September of this year with Dr. Trenton GammonHung Bone Density HM Status: is not applicable for this patient STI Testing HM Status: was declined   She reports regular vision exams q1-5y: Yes  She reports regular dental exams q 8478m:  Yes  The patient eats a regular, healthy diet. She endorses exercise and/or activity of:  walking outside some and at work.   She currently: Marital Status: single Living situation: alone Sexual: not sexually active Occupation: Manicurist  Most Recent Depression Screen:     10/19/2022    9:00 AM 10/14/2021    9:37 AM 07/08/2020    2:20 PM 06/20/2019     9:19 AM 05/24/2017    1:38 PM  Depression screen PHQ 2/9  Decreased Interest 0 0 0 0 0  Down, Depressed, Hopeless 0 0 0 0 0  PHQ - 2 Score 0 0 0 0 0   Most Recent Anxiety Screen:      No data to display         Most Recent Fall Screen:    10/19/2022    9:00 AM 10/14/2021    9:37 AM 09/02/2021    9:53 AM 07/08/2020    2:20 PM 06/20/2019    9:19 AM  Fall Risk   Falls in the past year? 0 0 0 0 0  Number falls in past yr: 0 0 0 0 0  Injury with Fall? 0 0 0 0 0  Risk for fall due to : No Fall Risks No Fall Risks No Fall Risks    Follow up Falls evaluation completed Falls evaluation completed Falls evaluation completed      Past medical history, surgical history, medications, allergies, family history and social history reviewed with patient today and changes made to appropriate areas of the chart.  Past Medical History:  Past Medical History:  Diagnosis Date   Adjustment disorder with mixed anxiety and depressed mood 03/22/2015   Allergy    Anxiety    Asymptomatic microscopic hematuria 05/31/2018   Body mass index (BMI) of 19.0 to  19.9 in adult 10/14/2021   GERD (gastroesophageal reflux disease) 12/06/2013   History of COVID-19 12/12/2019   History of positive PPD, treatment status unknown    Reflux    Medications:  Current Outpatient Medications on File Prior to Visit  Medication Sig   Multiple Vitamins-Minerals (MULTIVITAMIN WITH MINERALS) tablet Take 1 tablet by mouth daily. Reported on 09/30/2015   Omega-3 Fatty Acids (FISH OIL) 300 MG CAPS Take by mouth.   No current facility-administered medications on file prior to visit.   Surgical History:  No past surgical history on file. Allergies:  Allergies  Allergen Reactions   Doxycycline Hives   Penicillins     Pharmacist reports hives, and trouble breathing   Tetracyclines & Related Hives   Family History:  No family history on file.     Objective:    BP 122/76   Pulse 77   Ht 5\' 2"  (1.575 m)   Wt 113 lb  9.6 oz (51.5 kg)   BMI 20.78 kg/m   Wt Readings from Last 3 Encounters:  10/19/22 113 lb 9.6 oz (51.5 kg)  10/14/21 105 lb 12.8 oz (48 kg)  09/02/21 110 lb (49.9 kg)    Physical Exam Vitals and nursing note reviewed.  Constitutional:      General: She is not in acute distress.    Appearance: Normal appearance.  HENT:     Head: Normocephalic and atraumatic.     Right Ear: Hearing, tympanic membrane, ear canal and external ear normal.     Left Ear: Hearing, tympanic membrane, ear canal and external ear normal.     Nose: Nose normal. No congestion.     Right Sinus: No maxillary sinus tenderness or frontal sinus tenderness.     Left Sinus: No maxillary sinus tenderness or frontal sinus tenderness.     Mouth/Throat:     Lips: Pink.     Mouth: Mucous membranes are moist.     Pharynx: Oropharynx is clear.  Eyes:     General: Lids are normal. Vision grossly intact.     Extraocular Movements: Extraocular movements intact.     Conjunctiva/sclera: Conjunctivae normal.     Pupils: Pupils are equal, round, and reactive to light.     Funduscopic exam:    Right eye: Red reflex present.        Left eye: Red reflex present.    Visual Fields: Right eye visual fields normal and left eye visual fields normal.  Neck:     Thyroid: No thyromegaly.     Vascular: No carotid bruit.  Cardiovascular:     Rate and Rhythm: Normal rate and regular rhythm.     Chest Wall: PMI is not displaced.     Pulses: Normal pulses.          Dorsalis pedis pulses are 2+ on the right side and 2+ on the left side.       Posterior tibial pulses are 2+ on the right side and 2+ on the left side.     Heart sounds: Normal heart sounds. No murmur heard. Pulmonary:     Effort: Pulmonary effort is normal. No respiratory distress.     Breath sounds: Normal breath sounds.  Abdominal:     General: Abdomen is flat. Bowel sounds are normal. There is no distension.     Palpations: Abdomen is soft. There is no hepatomegaly,  splenomegaly or mass.     Tenderness: There is no abdominal tenderness. There is no right CVA tenderness, left  CVA tenderness, guarding or rebound.  Musculoskeletal:        General: Normal range of motion.     Cervical back: Full passive range of motion without pain, normal range of motion and neck supple. No tenderness.     Right lower leg: No edema.     Left lower leg: No edema.  Feet:     Left foot:     Toenail Condition: Left toenails are normal.  Lymphadenopathy:     Cervical: No cervical adenopathy.     Upper Body:     Right upper body: No supraclavicular adenopathy.     Left upper body: No supraclavicular adenopathy.  Skin:    General: Skin is warm and dry.     Capillary Refill: Capillary refill takes less than 2 seconds.     Nails: There is no clubbing.  Neurological:     General: No focal deficit present.     Mental Status: She is alert and oriented to person, place, and time.     GCS: GCS eye subscore is 4. GCS verbal subscore is 5. GCS motor subscore is 6.     Sensory: Sensation is intact.     Motor: Motor function is intact.     Coordination: Coordination is intact.     Gait: Gait is intact.     Deep Tendon Reflexes: Reflexes are normal and symmetric.  Psychiatric:        Attention and Perception: Attention normal.        Mood and Affect: Mood normal.        Speech: Speech normal.        Behavior: Behavior normal. Behavior is cooperative.        Thought Content: Thought content normal.        Cognition and Memory: Cognition and memory normal.        Judgment: Judgment normal.     Results for orders placed or performed in visit on 10/14/21  CBC with Differential/Platelet  Result Value Ref Range   WBC 5.6 3.4 - 10.8 x10E3/uL   RBC 4.72 3.77 - 5.28 x10E6/uL   Hemoglobin 13.8 11.1 - 15.9 g/dL   Hematocrit 16.1 09.6 - 46.6 %   MCV 88 79 - 97 fL   MCH 29.2 26.6 - 33.0 pg   MCHC 33.1 31.5 - 35.7 g/dL   RDW 04.5 40.9 - 81.1 %   Platelets 309 150 - 450 x10E3/uL    Neutrophils 57 Not Estab. %   Lymphs 35 Not Estab. %   Monocytes 6 Not Estab. %   Eos 1 Not Estab. %   Basos 1 Not Estab. %   Neutrophils Absolute 3.2 1.4 - 7.0 x10E3/uL   Lymphocytes Absolute 2.0 0.7 - 3.1 x10E3/uL   Monocytes Absolute 0.4 0.1 - 0.9 x10E3/uL   EOS (ABSOLUTE) 0.1 0.0 - 0.4 x10E3/uL   Basophils Absolute 0.0 0.0 - 0.2 x10E3/uL   Immature Granulocytes 0 Not Estab. %   Immature Grans (Abs) 0.0 0.0 - 0.1 x10E3/uL  Comprehensive metabolic panel  Result Value Ref Range   Glucose 94 70 - 99 mg/dL   BUN 12 6 - 24 mg/dL   Creatinine, Ser 9.14 0.57 - 1.00 mg/dL   eGFR 782 >95 AO/ZHY/8.65   BUN/Creatinine Ratio 18 9 - 23   Sodium 139 134 - 144 mmol/L   Potassium 4.5 3.5 - 5.2 mmol/L   Chloride 102 96 - 106 mmol/L   CO2 25 20 - 29 mmol/L   Calcium  9.9 8.7 - 10.2 mg/dL   Total Protein 7.1 6.0 - 8.5 g/dL   Albumin 4.5 3.8 - 4.9 g/dL   Globulin, Total 2.6 1.5 - 4.5 g/dL   Albumin/Globulin Ratio 1.7 1.2 - 2.2   Bilirubin Total 0.8 0.0 - 1.2 mg/dL   Alkaline Phosphatase 96 44 - 121 IU/L   AST 19 0 - 40 IU/L   ALT 17 0 - 32 IU/L  Lipid panel  Result Value Ref Range   Cholesterol, Total 222 (H) 100 - 199 mg/dL   Triglycerides 426 (H) 0 - 149 mg/dL   HDL 44 >83 mg/dL   VLDL Cholesterol Cal 65 (H) 5 - 40 mg/dL   LDL Chol Calc (NIH) 419 (H) 0 - 99 mg/dL   Chol/HDL Ratio 5.0 (H) 0.0 - 4.4 ratio         Assessment & Plan:   Problem List Items Addressed This Visit     Vitamin D deficiency    We will repeat labs today. She is not currently taking a supplement, but has in the past.       Relevant Orders   CBC with Differential/Platelet   Comprehensive metabolic panel   Hemoglobin A1c   Lipid panel   VITAMIN D 25 Hydroxy (Vit-D Deficiency, Fractures)   Environmental and seasonal allergies    She has seasonal allergy symptoms of itchy, watery eyes with no rhinorrhea or sinus congestion. At this time her symptoms are well controlled. She is unable to take oral  medications due to increased sleepiness as a side effect. She can consider Pataday eye drops to see if this is helpful for management given that the majority of her symptoms are related to the eyes. She will follow-up if she has any new or worsening symptoms.       GERD without esophagitis    Chronic gastric reflux managed with Dr. Elnoria Howard (GI). She does have PRN medication at home to take for symptom management. No alarm symptoms are present today. No abdominal tenderness or signs of bleeding. Labs are pending. Will monitor. Colonoscopy scheduled for September.       Mixed hyperlipidemia    Labs pending today for evaluation.       Relevant Orders   CBC with Differential/Platelet   Comprehensive metabolic panel   Hemoglobin A1c   Lipid panel   VITAMIN D 25 Hydroxy (Vit-D Deficiency, Fractures)   Need for shingles vaccine    She will come back next week to have the initial vaccine in the series with plan to complete the second vaccine 2-6 months later.       Routine medical exam - Primary    CPE today with no abnormalities noted on exam.  Labs pending. Will make changes as necessary based on results.  Review of HM activities and recommendations discussed and provided on AVS Anticipatory guidance, diet, and exercise recommendations provided.  Medications, allergies, and hx reviewed and updated as necessary.  Plan to f/u with CPE in 1 year or sooner for acute/chronic health needs as directed.        Relevant Orders   CBC with Differential/Platelet   Comprehensive metabolic panel   Hemoglobin A1c   Lipid panel   VITAMIN D 25 Hydroxy (Vit-D Deficiency, Fractures)   Other Visit Diagnoses     Health care maintenance       Relevant Orders   CBC with Differential/Platelet   Comprehensive metabolic panel   Hemoglobin A1c   Lipid panel   VITAMIN D  25 Hydroxy (Vit-D Deficiency, Fractures)         Follow up plan: Return in about 1 year (around 10/19/2023) for CPE- Will need nurse  visit for Shingles shot in near future. Marland Kitchen  NEXT PREVENTATIVE PHYSICAL DUE IN 1 YEAR.  PATIENT COUNSELING PROVIDED FOR ALL ADULT PATIENTS:  Consume a well balanced diet low in saturated fats, cholesterol, and moderation in carbohydrates.   This can be as simple as monitoring portion sizes and cutting back on sugary beverages such as soda and juice to start with.    Daily water consumption of at least 64 ounces.  Physical activity at least 180 minutes per week, if just starting out.   This can be as simple as taking the stairs instead of the elevator and walking 2-3 laps around the office  purposefully every day.   STD protection, partner selection, and regular testing if high risk.  Limited consumption of alcoholic beverages if alcohol is consumed.  For women, I recommend no more than 7 alcoholic beverages per week, spread out throughout the week.  Avoid "binge" drinking or consuming large quantities of alcohol in one setting.   Please let me know if you feel you may need help with reduction or quitting alcohol consumption.   Avoidance of nicotine, if used.  Please let me know if you feel you may need help with reduction or quitting nicotine use.   Daily mental health attention.  This can be in the form of 5 minute daily meditation, prayer, journaling, yoga, reflection, etc.   Purposeful attention to your emotions and mental state can significantly improve your overall wellbeing and Health.  Please know that I am here to help you with all of your health care goals and am happy to work with you to find a solution that works best for you.  The greatest advice I have received with any changes in life are to take it one step at a time, that even means if all you can focus on is the next 60 seconds, then do that and celebrate your victories.  With any changes in life, you will have set backs, and that is OK. The important thing to remember is, if you have a set back, it is not a failure, it  is an opportunity to try again!  Health Maintenance Recommendations Screening Testing Mammogram Every 1 -2 years based on history and risk factors Starting at age 3 Pap Smear Ages 21-39 every 3 years Ages 32-65 every 5 years with HPV testing More frequent testing may be required based on results and history Colon Cancer Screening Every 1-10 years based on test performed, risk factors, and history Starting at age 34 Bone Density Screening Every 2-10 years based on history Starting at age 45 for women Recommendations for men differ based on medication usage, history, and risk factors AAA Screening One time ultrasound Men 60-42 years old who have every smoked Lung Cancer Screening Low Dose Lung CT every 12 months Age 18-80 years with a 30 pack-year smoking history who still smoke or who have quit within the last 15 years  Screening Labs Routine  Labs: Complete Blood Count (CBC), Complete Metabolic Panel (CMP), Cholesterol (Lipid Panel) Every 6-12 months based on history and medications May be recommended more frequently based on current conditions or previous results Hemoglobin A1c Lab Every 3-12 months based on history and previous results Starting at age 30 or earlier with diagnosis of diabetes, high cholesterol, BMI >26, and/or risk factors Frequent monitoring  for patients with diabetes to ensure blood sugar control Thyroid Panel (TSH w/ T3 & T4) Every 6 months based on history, symptoms, and risk factors May be repeated more often if on medication HIV One time testing for all patients 65 and older May be repeated more frequently for patients with increased risk factors or exposure Hepatitis C One time testing for all patients 52 and older May be repeated more frequently for patients with increased risk factors or exposure Gonorrhea, Chlamydia Every 12 months for all sexually active persons 13-24 years Additional monitoring may be recommended for those who are considered  high risk or who have symptoms PSA Men 53-77 years old with risk factors Additional screening may be recommended from age 46-69 based on risk factors, symptoms, and history  Vaccine Recommendations Tetanus Booster All adults every 10 years Flu Vaccine All patients 6 months and older every year COVID Vaccine All patients 12 years and older Initial dosing with booster May recommend additional booster based on age and health history HPV Vaccine 2 doses all patients age 51-26 Dosing may be considered for patients over 26 Shingles Vaccine (Shingrix) 2 doses all adults 55 years and older Pneumonia (Pneumovax 23) All adults 65 years and older May recommend earlier dosing based on health history Pneumonia (Prevnar 95) All adults 65 years and older Dosed 1 year after Pneumovax 23  Additional Screening, Testing, and Vaccinations may be recommended on an individualized basis based on family history, health history, risk factors, and/or exposure.

## 2022-10-19 NOTE — Assessment & Plan Note (Signed)
Chronic gastric reflux managed with Dr. Elnoria Howard (GI). She does have PRN medication at home to take for symptom management. No alarm symptoms are present today. No abdominal tenderness or signs of bleeding. Labs are pending. Will monitor. Colonoscopy scheduled for September.

## 2022-10-19 NOTE — Assessment & Plan Note (Signed)

## 2022-10-19 NOTE — Assessment & Plan Note (Signed)
She will come back next week to have the initial vaccine in the series with plan to complete the second vaccine 2-6 months later.

## 2022-10-19 NOTE — Patient Instructions (Addendum)
It was a pleasure to meet you today! I look forwarded to working with you.   I will send you a message on MyChart once I have reviewed your labs and let you know if we need to make changes.   I recommend the Shingles vaccine to protect against the Shingles Virus (Herpes Zoster). I have included information on this on the back of the paperwork.   For all adult patients, I recommend A well balanced diet low in saturated fats, cholesterol, and moderation in carbohydrates.   This can be as simple as monitoring portion sizes and cutting back on sugary beverages such as soda and juice to start with.    Daily water consumption of at least 64 ounces.  Physical activity at least 180 minutes per week, if just starting out.   This can be as simple as taking the stairs instead of the elevator and walking 2-3 laps around the office  purposefully every day.   STD protection, partner selection, and regular testing if high risk.  Limited consumption of alcoholic beverages if alcohol is consumed.  For women, I recommend no more than 7 alcoholic beverages per week, spread out throughout the week.  Avoid "binge" drinking or consuming large quantities of alcohol in one setting.   Please let me know if you feel you may need help with reduction or quitting alcohol consumption.   Avoidance of nicotine, if used.  Please let me know if you feel you may need help with reduction or quitting nicotine use.   Daily mental health attention.  This can be in the form of 5 minute daily meditation, prayer, journaling, yoga, reflection, etc.   Purposeful attention to your emotions and mental state can significantly improve your overall wellbeing and Health.  Please know that I am here to help you with all of your health care goals and am happy to work with you to find a solution that works best for you.  The greatest advice I have received with any changes in life are to take it one step at a time, that even means if all  you can focus on is the next 60 seconds, then do that and celebrate your victories.  With any changes in life, you will have set backs, and that is OK. The important thing to remember is, if you have a set back, it is not a failure, it is an opportunity to try again!  Health Maintenance Recommendations Screening Testing Mammogram Every 1 -2 years based on history and risk factors Starting at age 27 Pap Smear Ages 21-39 every 3 years Ages 88-65 every 5 years with HPV testing More frequent testing may be required based on results and history Colon Cancer Screening Every 1-10 years based on test performed, risk factors, and history Starting at age 72 Bone Density Screening Every 2-10 years based on history Starting at age 12 for women Recommendations for men differ based on medication usage, history, and risk factors AAA Screening One time ultrasound Men 18-37 years old who have every smoked Lung Cancer Screening Low Dose Lung CT every 12 months Age 7-80 years with a 30 pack-year smoking history who still smoke or who have quit within the last 15 years  Screening Labs Routine  Labs: Complete Blood Count (CBC), Complete Metabolic Panel (CMP), Cholesterol (Lipid Panel) Every 6-12 months based on history and medications May be recommended more frequently based on current conditions or previous results Hemoglobin A1c Lab Every 3-12 months based on history and  previous results Starting at age 88 or earlier with diagnosis of diabetes, high cholesterol, BMI >26, and/or risk factors Frequent monitoring for patients with diabetes to ensure blood sugar control Thyroid Panel (TSH w/ T3 & T4) Every 6 months based on history, symptoms, and risk factors May be repeated more often if on medication HIV One time testing for all patients 75 and older May be repeated more frequently for patients with increased risk factors or exposure Hepatitis C One time testing for all patients 44 and  older May be repeated more frequently for patients with increased risk factors or exposure Gonorrhea, Chlamydia Every 12 months for all sexually active persons 13-24 years Additional monitoring may be recommended for those who are considered high risk or who have symptoms PSA Men 80-25 years old with risk factors Additional screening may be recommended from age 13-69 based on risk factors, symptoms, and history  Vaccine Recommendations Tetanus Booster All adults every 10 years Flu Vaccine All patients 6 months and older every year COVID Vaccine All patients 12 years and older Initial dosing with booster May recommend additional booster based on age and health history HPV Vaccine 2 doses all patients age 27-26 Dosing may be considered for patients over 26 Shingles Vaccine (Shingrix) 2 doses all adults 55 years and older Pneumonia (Pneumovax 23) All adults 65 years and older May recommend earlier dosing based on health history Pneumonia (Prevnar 81) All adults 65 years and older Dosed 1 year after Pneumovax 23  Additional Screening, Testing, and Vaccinations may be recommended on an individualized basis based on family history, health history, risk factors, and/or exposure.

## 2022-10-19 NOTE — Assessment & Plan Note (Signed)
Labs pending today for evaluation.

## 2022-10-20 ENCOUNTER — Encounter: Payer: 59 | Admitting: Physician Assistant

## 2022-10-20 LAB — CBC WITH DIFFERENTIAL/PLATELET
Basophils Absolute: 0 10*3/uL (ref 0.0–0.2)
Basos: 0 %
EOS (ABSOLUTE): 0.1 10*3/uL (ref 0.0–0.4)
Eos: 1 %
Hematocrit: 42.2 % (ref 34.0–46.6)
Hemoglobin: 14.2 g/dL (ref 11.1–15.9)
Immature Grans (Abs): 0 10*3/uL (ref 0.0–0.1)
Immature Granulocytes: 0 %
Lymphocytes Absolute: 2.1 10*3/uL (ref 0.7–3.1)
Lymphs: 35 %
MCH: 29.7 pg (ref 26.6–33.0)
MCHC: 33.6 g/dL (ref 31.5–35.7)
MCV: 88 fL (ref 79–97)
Monocytes Absolute: 0.4 10*3/uL (ref 0.1–0.9)
Monocytes: 6 %
Neutrophils Absolute: 3.5 10*3/uL (ref 1.4–7.0)
Neutrophils: 58 %
Platelets: 302 10*3/uL (ref 150–450)
RBC: 4.78 x10E6/uL (ref 3.77–5.28)
RDW: 12.3 % (ref 11.7–15.4)
WBC: 6 10*3/uL (ref 3.4–10.8)

## 2022-10-20 LAB — LIPID PANEL
Chol/HDL Ratio: 4.3 ratio (ref 0.0–4.4)
Cholesterol, Total: 243 mg/dL — ABNORMAL HIGH (ref 100–199)
HDL: 57 mg/dL (ref >39)
LDL Chol Calc (NIH): 148 mg/dL — ABNORMAL HIGH (ref 0–99)
Triglycerides: 212 mg/dL — ABNORMAL HIGH (ref 0–149)
VLDL Cholesterol Cal: 38 mg/dL (ref 5–40)

## 2022-10-20 LAB — COMPREHENSIVE METABOLIC PANEL
ALT: 22 IU/L (ref 0–32)
AST: 21 IU/L (ref 0–40)
Albumin/Globulin Ratio: 1.8 (ref 1.2–2.2)
Albumin: 4.6 g/dL (ref 3.8–4.9)
Alkaline Phosphatase: 94 IU/L (ref 44–121)
BUN/Creatinine Ratio: 21 (ref 9–23)
BUN: 15 mg/dL (ref 6–24)
Bilirubin Total: 0.8 mg/dL (ref 0.0–1.2)
CO2: 22 mmol/L (ref 20–29)
Calcium: 9.9 mg/dL (ref 8.7–10.2)
Chloride: 103 mmol/L (ref 96–106)
Creatinine, Ser: 0.71 mg/dL (ref 0.57–1.00)
Globulin, Total: 2.5 g/dL (ref 1.5–4.5)
Glucose: 90 mg/dL (ref 70–99)
Potassium: 4.8 mmol/L (ref 3.5–5.2)
Sodium: 141 mmol/L (ref 134–144)
Total Protein: 7.1 g/dL (ref 6.0–8.5)
eGFR: 100 mL/min/{1.73_m2} (ref >59)

## 2022-10-20 LAB — HEMOGLOBIN A1C
Est. average glucose Bld gHb Est-mCnc: 120 mg/dL
Hgb A1c MFr Bld: 5.8 % — ABNORMAL HIGH (ref 4.8–5.6)

## 2022-10-20 LAB — VITAMIN D 25 HYDROXY (VIT D DEFICIENCY, FRACTURES): Vit D, 25-Hydroxy: 26 ng/mL — ABNORMAL LOW (ref 30.0–100.0)

## 2022-10-26 ENCOUNTER — Telehealth: Payer: Self-pay | Admitting: Nurse Practitioner

## 2022-10-26 ENCOUNTER — Other Ambulatory Visit: Payer: Self-pay

## 2022-10-26 ENCOUNTER — Other Ambulatory Visit (INDEPENDENT_AMBULATORY_CARE_PROVIDER_SITE_OTHER): Payer: 59

## 2022-10-26 DIAGNOSIS — Z23 Encounter for immunization: Secondary | ICD-10-CM

## 2022-10-26 MED ORDER — CETIRIZINE HCL 10 MG PO TABS: ORAL_TABLET | ORAL | 5 refills | Status: DC

## 2022-10-26 NOTE — Telephone Encounter (Signed)
Pt came in for injection and while here she states that at her last appt she was advised that if she needed her allergy med sent in to let us know. She states that she does need that sent in to a NEW [facility]. Please send to [facility]  on [address].

## 2022-11-10 ENCOUNTER — Telehealth: Payer: Self-pay | Admitting: Nurse Practitioner

## 2022-11-10 DIAGNOSIS — H9193 Unspecified hearing loss, bilateral: Secondary | ICD-10-CM

## 2022-11-10 NOTE — Telephone Encounter (Signed)
Pt & Terri Wall called & pt would like referral to Dr. Suszanne Conners for issues with hearing from working in [employer] in [place].

## 2022-12-28 ENCOUNTER — Other Ambulatory Visit (INDEPENDENT_AMBULATORY_CARE_PROVIDER_SITE_OTHER): Payer: 59

## 2022-12-28 DIAGNOSIS — Z23 Encounter for immunization: Secondary | ICD-10-CM | POA: Diagnosis not present

## 2023-02-08 ENCOUNTER — Ambulatory Visit: Payer: 59 | Admitting: Medical

## 2023-02-08 VITALS — BP 122/80 | HR 80 | Temp 97.5°F | Wt 110.6 lb

## 2023-02-08 DIAGNOSIS — L259 Unspecified contact dermatitis, unspecified cause: Secondary | ICD-10-CM | POA: Diagnosis not present

## 2023-02-08 MED ORDER — TRIAMCINOLONE ACETONIDE 0.1 % EX CREA: 1.0000 | TOPICAL_CREAM | Freq: Two times a day (BID) | CUTANEOUS | 0 refills | Status: DC

## 2023-02-08 NOTE — Progress Notes (Signed)
Subjective:  Terri Wall is a 57 y.o. female who presents for Chief Complaint  Patient presents with   rash    Rash on neck- had shingles shot in June and then it rash popped up 2 weeks later, still itchy     Here for rash on neck.  This started sometime in late June about 2 weeks after getting his shingles vaccine.  Rash on the back and neck itchy.  She has a picture on her phone and it was worse.  She started using Benadryl topically and aloe topically and it has improved quite a bit but not completely gone.  No prior similar rash.  No other triggers that she knows of.  Scalp is itchy but no obvious rash in the scalp  Otherwise normal state of health.  She is a Agricultural engineer.  No other aggravating or relieving factors.    No other c/o.  The following portions of the patient's history were reviewed and updated as appropriate: allergies, current medications, past family history, past medical history, past social history, past surgical history and problem list.  ROS Otherwise as in subjective above  Objective: BP 122/80   Pulse 80   Temp (!) 97.5 F (36.4 C)   Wt 110 lb 9.6 oz (50.2 kg)   BMI 20.23 kg/m   General appearance: alert, no distress, well developed, well nourished Skin: Scattered somewhat raised rough rash discoloration on the back of the neck but improved compared to pictures she has from several weeks ago.  No other rash.    Assessment: Encounter Diagnosis  Name Primary?   Contact dermatitis, unspecified contact dermatitis type, unspecified trigger Yes     Plan: Improving with aloe over-the-counter and Benadryl topically.  Begin cream below twice daily, can still use aloe intermittent.  If not resolved within the next 7 days then let me know.  We also discussed Selsun Blue shampoo once or twice per week for the next few weeks for the itchy scalp.  Follow-up as needed  Terri Wall was seen today for rash.  Diagnoses and all orders for this visit:  Contact  dermatitis, unspecified contact dermatitis type, unspecified trigger  Other orders -     triamcinolone cream (KENALOG) 0.1 %; Apply 1 Application topically 2 (two) times daily.    Follow up: prn

## 2023-03-10 ENCOUNTER — Other Ambulatory Visit: Payer: Self-pay | Admitting: Nurse Practitioner

## 2023-03-10 DIAGNOSIS — Z1231 Encounter for screening mammogram for malignant neoplasm of breast: Secondary | ICD-10-CM

## 2023-03-30 ENCOUNTER — Encounter: Payer: Self-pay | Admitting: Gastroenterology

## 2023-03-30 DIAGNOSIS — K6389 Other specified diseases of intestine: Secondary | ICD-10-CM | POA: Diagnosis not present

## 2023-03-30 DIAGNOSIS — K562 Volvulus: Secondary | ICD-10-CM | POA: Diagnosis not present

## 2023-03-30 DIAGNOSIS — D123 Benign neoplasm of transverse colon: Secondary | ICD-10-CM | POA: Diagnosis not present

## 2023-03-30 DIAGNOSIS — K635 Polyp of colon: Secondary | ICD-10-CM | POA: Diagnosis not present

## 2023-03-30 DIAGNOSIS — Z1211 Encounter for screening for malignant neoplasm of colon: Secondary | ICD-10-CM | POA: Diagnosis not present

## 2023-03-30 LAB — HM COLONOSCOPY

## 2023-04-06 ENCOUNTER — Ambulatory Visit
Admission: RE | Admit: 2023-04-06 | Discharge: 2023-04-06 | Disposition: A | Discharge status: Home / Self Care | Payer: 59 | Source: Ambulatory Visit | Attending: Nurse Practitioner | Admitting: Nurse Practitioner

## 2023-04-06 DIAGNOSIS — Z1231 Encounter for screening mammogram for malignant neoplasm of breast: Secondary | ICD-10-CM

## 2023-06-08 ENCOUNTER — Encounter: Payer: Self-pay | Admitting: Nurse Practitioner

## 2023-06-08 ENCOUNTER — Ambulatory Visit (INDEPENDENT_AMBULATORY_CARE_PROVIDER_SITE_OTHER): Payer: 59 | Admitting: Nurse Practitioner

## 2023-06-08 VITALS — BP 122/78 | HR 78 | Wt 109.0 lb

## 2023-06-08 DIAGNOSIS — Z2809 Immunization not carried out because of other contraindication: Secondary | ICD-10-CM

## 2023-06-08 DIAGNOSIS — G479 Sleep disorder, unspecified: Secondary | ICD-10-CM

## 2023-06-08 DIAGNOSIS — R7303 Prediabetes: Secondary | ICD-10-CM | POA: Diagnosis not present

## 2023-06-08 DIAGNOSIS — L609 Nail disorder, unspecified: Secondary | ICD-10-CM | POA: Diagnosis not present

## 2023-06-08 DIAGNOSIS — E782 Mixed hyperlipidemia: Secondary | ICD-10-CM | POA: Diagnosis not present

## 2023-06-08 DIAGNOSIS — E559 Vitamin D deficiency, unspecified: Secondary | ICD-10-CM

## 2023-06-08 NOTE — Assessment & Plan Note (Signed)
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 Requested blood tests for vitamin D and cholesterol levels. - Order blood tests for vitamin D, vitamin B12, iron levels, and thyroid function

## 2023-06-08 NOTE — Assessment & Plan Note (Signed)
History of reaction to shingles vaccine with severe hives and plaques. Monitor closely with any future vaccines.

## 2023-06-08 NOTE — Assessment & Plan Note (Signed)
Healthy diet and active lifestyle reported. We discussed the genetic component, although no known family history present. Will monitor labs today.  - Continue with a health diet and exercise daily.

## 2023-06-08 NOTE — Progress Notes (Signed)
Shawna Clamp, DNP, AGNP-c [facility]  [address] [place], [place] [address] [phone]  ESTABLISHED PATIENT- Chronic Health and/or Follow-Up Visit  Blood pressure 122/78, pulse 78, weight 109 lb (49.4 kg).    Terri Wall is a 57 y.o. year old female presenting today for evaluation and management of chronic conditions.   The patient, a Agricultural engineer by profession, presented with a history of an adverse reaction to the shingles vaccine, which manifested as severe itching and a rash that spread across the neck, belly, and back. The patient reported that the symptoms improved significantly after a week of using a steroid cream provided by my colleague.   In addition, the patient reported experiencing difficulty sleeping, which she attributed to late-night phone use. She plans to work on reducing her blue light exposure.   The patient also reported a history of gastrointestinal issues, for which she occasionally takes medication for acid reflux. She has undergone colonoscopy and endoscopy in the past, with the most recent colonoscopy revealing two polyps that were subsequently removed. The patient is due for a follow-up colonoscopy in seven years.  The patient also reported concerns about high cholesterol levels despite maintaining a diet rich in vegetables and low in fatty foods. She has been taking vitamin D, K2, and fish oil supplements in an attempt to manage her cholesterol levels.   The patient also reported noticing changes in her fingernails, which have become ridged and two nails have a dark vertical line throughout the nail. She was unsure if this was due to her work as a Agricultural engineer or a potential vitamin deficiency. The patient denied any brittleness or easy breakage of the nails.  All ROS negative with exception of what is listed above.   PHYSICAL EXAM Physical Exam Vitals and nursing note reviewed.  Constitutional:      Appearance: Normal appearance.  HENT:      Head: Normocephalic.  Eyes:     Pupils: Pupils are equal, round, and reactive to light.  Cardiovascular:     Rate and Rhythm: Normal rate and regular rhythm.     Pulses: Normal pulses.     Heart sounds: Normal heart sounds.  Pulmonary:     Effort: Pulmonary effort is normal.     Breath sounds: Normal breath sounds.  Abdominal:     General: Abdomen is flat. Bowel sounds are normal. There is no distension.     Palpations: Abdomen is soft.     Tenderness: There is no abdominal tenderness.  Musculoskeletal:        General: Normal range of motion.     Cervical back: Normal range of motion.  Skin:    General: Skin is warm.     Capillary Refill: Capillary refill takes less than 2 seconds.     Comments: Vertical ridges in all nails  Single vertical darker line in the middle finger of the right hand and index finger on the left hand.   Neurological:     General: No focal deficit present.     Mental Status: She is alert and oriented to person, place, and time.  Psychiatric:        Mood and Affect: Mood normal.      PLAN Problem List Items Addressed This Visit     Prediabetes - Primary    Healthy diet and active lifestyle reported. We discussed the genetic component, although no known family history present. Will monitor labs today.  - Continue with a health diet and exercise daily.  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 Requested blood tests for vitamin D and cholesterol levels. - Order blood tests for vitamin D, vitamin B12, iron levels, and thyroid function      Relevant Orders   Vitamin B12   Iron, TIBC and Ferritin Panel   TSH   T4, free   Sleep disturbance    Difficulty sleeping, likely  due to late-night phone use. No other contributing factors identified. - Advise on sleep hygiene, including reducing screen time before bed      Vaccine contraindicated    History of reaction to shingles vaccine with severe hives and plaques. Monitor closely with any future vaccines.       Vitamin D deficiency   Relevant Orders   VITAMIN D 25 Hydroxy (Vit-D Deficiency, Fractures)   Mixed hyperlipidemia   Relevant Orders   Hemoglobin A1c   CBC with Differential/Platelet   Comprehensive metabolic panel   Lipid panel   VITAMIN D 25 Hydroxy (Vit-D Deficiency, Fractures)    Return in about 6 months (around 12/06/2023) for CPE.  Shawna Clamp, DNP, AGNP-c

## 2023-06-08 NOTE — Patient Instructions (Signed)
We are checking your vitamin levels and thyroid today to make sure these are normal and not causing the ridges in your finger nails.   We are also checking your blood sugar and cholesterol levels to make sure these look ok.   If there are any concerns I will let you know.

## 2023-06-08 NOTE — Assessment & Plan Note (Signed)
Difficulty sleeping, likely due to late-night phone use. No other contributing factors identified. - Advise on sleep hygiene, including reducing screen time before bed

## 2023-06-09 LAB — CBC WITH DIFFERENTIAL/PLATELET
Basophils Absolute: 0 10*3/uL (ref 0.0–0.2)
Basos: 1 %
EOS (ABSOLUTE): 0.1 10*3/uL (ref 0.0–0.4)
Eos: 1 %
Hematocrit: 43.4 % (ref 34.0–46.6)
Hemoglobin: 14.6 g/dL (ref 11.1–15.9)
Immature Grans (Abs): 0 10*3/uL (ref 0.0–0.1)
Immature Granulocytes: 0 %
Lymphocytes Absolute: 2 10*3/uL (ref 0.7–3.1)
Lymphs: 31 %
MCH: 30.4 pg (ref 26.6–33.0)
MCHC: 33.6 g/dL (ref 31.5–35.7)
MCV: 90 fL (ref 79–97)
Monocytes Absolute: 0.4 10*3/uL (ref 0.1–0.9)
Monocytes: 6 %
Neutrophils Absolute: 4 10*3/uL (ref 1.4–7.0)
Neutrophils: 61 %
Platelets: 288 10*3/uL (ref 150–450)
RBC: 4.81 x10E6/uL (ref 3.77–5.28)
RDW: 12 % (ref 11.7–15.4)
WBC: 6.4 10*3/uL (ref 3.4–10.8)

## 2023-06-09 LAB — HEMOGLOBIN A1C
Est. average glucose Bld gHb Est-mCnc: 117 mg/dL
Hgb A1c MFr Bld: 5.7 % — ABNORMAL HIGH (ref 4.8–5.6)

## 2023-06-09 LAB — COMPREHENSIVE METABOLIC PANEL
ALT: 22 IU/L (ref 0–32)
AST: 21 IU/L (ref 0–40)
Albumin: 4.7 g/dL (ref 3.8–4.9)
Alkaline Phosphatase: 85 IU/L (ref 44–121)
BUN/Creatinine Ratio: 20 (ref 9–23)
BUN: 15 mg/dL (ref 6–24)
Bilirubin Total: 0.8 mg/dL (ref 0.0–1.2)
CO2: 19 mmol/L — ABNORMAL LOW (ref 20–29)
Calcium: 9.8 mg/dL (ref 8.7–10.2)
Chloride: 104 mmol/L (ref 96–106)
Creatinine, Ser: 0.76 mg/dL (ref 0.57–1.00)
Globulin, Total: 2.6 g/dL (ref 1.5–4.5)
Glucose: 91 mg/dL (ref 70–99)
Potassium: 4.2 mmol/L (ref 3.5–5.2)
Sodium: 141 mmol/L (ref 134–144)
Total Protein: 7.3 g/dL (ref 6.0–8.5)
eGFR: 91 mL/min/{1.73_m2} (ref >59)

## 2023-06-09 LAB — IRON,TIBC AND FERRITIN PANEL
Ferritin: 247 ng/mL — ABNORMAL HIGH (ref 15–150)
Iron Saturation: 26 % (ref 15–55)
Iron: 90 ug/dL (ref 27–159)
Total Iron Binding Capacity: 345 ug/dL (ref 250–450)
UIBC: 255 ug/dL (ref 131–425)

## 2023-06-09 LAB — VITAMIN B12: Vitamin B-12: 490 pg/mL (ref 232–1245)

## 2023-06-09 LAB — LIPID PANEL
Cholesterol, Total: 231 mg/dL — ABNORMAL HIGH (ref 100–199)
HDL: 54 mg/dL (ref >39)
LDL CALC COMMENT:: 4.3 ratio (ref 0.0–4.4)
LDL Chol Calc (NIH): 136 mg/dL — ABNORMAL HIGH (ref 0–99)
Triglycerides: 230 mg/dL — ABNORMAL HIGH (ref 0–149)
VLDL Cholesterol Cal: 41 mg/dL — ABNORMAL HIGH (ref 5–40)

## 2023-06-09 LAB — VITAMIN D 25 HYDROXY (VIT D DEFICIENCY, FRACTURES): Vit D, 25-Hydroxy: 53.6 ng/mL (ref 30.0–100.0)

## 2023-06-09 LAB — T4, FREE: Free T4: 1.3 ng/dL (ref 0.82–1.77)

## 2023-06-09 LAB — TSH: TSH: 1.78 u[IU]/mL (ref 0.450–4.500)

## 2023-06-14 ENCOUNTER — Telehealth: Payer: Self-pay | Admitting: Nurse Practitioner

## 2023-06-14 NOTE — Telephone Encounter (Signed)
Pt called and has seen her lab results on [facility]. Huntley Dec has not left a comment and I advised she was out of the office at this time. She asks if another provider sees anything concerning if someone can give her a call.

## 2023-06-14 NOTE — Telephone Encounter (Signed)
Can you look at her labs and comment on them please

## 2023-06-14 NOTE — Telephone Encounter (Signed)
Pt was notified of results, however she is coming in tomorrow to discuss lab results. She was advised that Vincenza Hews would not make a decision about medication until Minna Merritts comes back. She says she is still going to come in to go over labs tomorrow anyways

## 2023-06-15 ENCOUNTER — Encounter: Payer: Self-pay | Admitting: Medical

## 2023-06-15 ENCOUNTER — Ambulatory Visit: Payer: 59 | Admitting: Medical

## 2023-06-15 VITALS — BP 120/70 | HR 76 | Ht 62.0 in | Wt 107.4 lb

## 2023-06-15 DIAGNOSIS — R21 Rash and other nonspecific skin eruption: Secondary | ICD-10-CM

## 2023-06-15 DIAGNOSIS — E782 Mixed hyperlipidemia: Secondary | ICD-10-CM

## 2023-06-15 DIAGNOSIS — L509 Urticaria, unspecified: Secondary | ICD-10-CM

## 2023-06-15 MED ORDER — HYDROXYZINE HCL 10 MG PO TABS: 10.0000 mg | ORAL_TABLET | Freq: Two times a day (BID) | ORAL | 1 refills | Status: DC

## 2023-06-15 MED ORDER — CLOTRIMAZOLE-BETAMETHASONE 1-0.05 % EX CREA: 1.0000 | TOPICAL_CREAM | Freq: Every day | CUTANEOUS | 0 refills | Status: DC

## 2023-06-15 NOTE — Progress Notes (Signed)
Subjective:  Terri Wall is a 57 y.o. female who presents for Chief Complaint  Patient presents with   Follow-up    Patient is here to discuss lab results. Also has been having some issues with itching, gone today though. Said it started after shingles vaccine.      Here for follow-up on recent labs.  She saw her PCP Minna Merritts here recently for labs.  She also discussed the lipids and labs in general.  She feels like she eats pretty healthy and exercises.  She is surprised that her labs continue to show elevated triglycerides and LDL.  She does not eat a lot of cheese or eggs or red meat.  She does eat a fair amount of rice.  She gets hives periodically.  She has pictures on her phone from some recent rash but they will come and go fairly quickly.  But they are still having somewhat often  She also saw me for a rash a few weeks ago.  Most of the rashes resolved except for a spot behind her left ear within the scalp line.  It will not go away with the triamcinolone cream.  She has a dermatologist and tried to schedule but the appointment is too far out  No other aggravating or relieving factors.    No other c/o.  Past Medical History:  Diagnosis Date   Adjustment disorder with mixed anxiety and depressed mood 03/22/2015   Allergy    Anxiety    Asymptomatic microscopic hematuria 05/31/2018   Body mass index (BMI) of 19.0 to 19.9 in adult 10/14/2021   GERD (gastroesophageal reflux disease) 12/06/2013   History of COVID-19 12/12/2019   History of positive PPD, treatment status unknown    Reflux    Routine medical exam 10/19/2022   Current Outpatient Medications on File Prior to Visit  Medication Sig Dispense Refill   Cholecalciferol (VITAMIN D3) 50 MCG (2000 UT) CAPS Take by mouth.     Multiple Vitamins-Minerals (MULTIVITAMIN WITH MINERALS) tablet Take 1 tablet by mouth daily. Reported on 09/30/2015     Omega-3 Fatty Acids (FISH OIL) 300 MG CAPS Take by mouth.     No current  facility-administered medications on file prior to visit.     The following portions of the patient's history were reviewed and updated as appropriate: allergies, current medications, past family history, past medical history, past social history, past surgical history and problem list.  ROS Otherwise as in subjective above  Objective: BP 120/70   Pulse 76   Ht 5\' 2"  (1.575 m)   Wt 107 lb 6.4 oz (48.7 kg)   SpO2 98%   BMI 19.64 kg/m   General appearance: alert, no distress, well developed, well nourished Skin: Behind left ear within scalp on is a approximately 3 cm patch of rough skin with erythema and some flaking of skin, nonspecific    Assessment: Encounter Diagnoses  Name Primary?   Mixed dyslipidemia Yes   Hives    Rash      Plan: Mixed dyslipidemia-counseled on diet, exercise, foods to avoid.  Consider medication.  She declines statin today although this was recommended.  She has had elevated LDL for several years now.  We spent a lot of time discussing dietary recommendations.  She wants to follow-up in 4-6 months to recheck labs fasting  Hives-intermittent, somewhat reoccurring.  Can use hydroxyzine as needed, can use triamcinolone she has at home for topical treatment.  We discussed keeping a symptom diary.  We discussed  using Dove sensitive skin soap and hypoallergenic detergent.  If symptoms continue will need referral to allergist or dermatology  Rash behind left ear-begin short-term trial of Lotrisone cream.  If not resolved within the next 2 weeks to let me know.  The other rash she had from last visit fortunately cleared up with triamcinolone   M[place]hell was seen today for follow-up.  Diagnoses and all orders for this visit:  Mixed dyslipidemia  Hives  Rash  Other orders -     clotrimazole-betamethasone (LOTRISONE) cream; Apply 1 Application topically daily. -     hydrOXYzine (ATARAX) 10 MG tablet; Take 1 tablet (10 mg total) by mouth in the morning and  at bedtime.    Follow up: 49mo

## 2023-06-15 NOTE — Patient Instructions (Addendum)
Recommendations for improving lipids:  Foods TO AVOID or limit - fried foods, high sugar foods, white bread, enriched flour, fast food, red meat, large amounts of cheese, processed foods such as little debbie cakes, cookies, pies, donuts, for example  Foods TO INCLUDE in the diet - whole grains such as whole grain pasta, whole grain bread, barley, steel cut oatmeal (not instant oatmeal), avocado, fish, green leafy vegetables, nuts, increased fiber in diet, and using olive oil in small amounts for cooking or as salad dressing vinaigrette.    Work on diet changes.  Plan to recheck fasting labs in 3-4 months.  If LDL is still high at that time, we will recommend a medication daily to lower cholesterol and lower risk of heart disease and stroke.  Cut down on serving size of rice    Regarding hives, Switch to a hypoallergenic detergent to wash clothes Consider using dove sensitive skin soap in case a chemical in your soap is aggravating the skin Keep a diary of when you have rash and what you have eaten or used on your body such as perfume or other skin care products Hives is a sign of skin irritation or possible allergy If this continues, you may need to see either dermatology or allergist There is an oral medication called hydroxyzine you can use once or twice daily for hives.   I sent this to pharmacy    Regarding the patch of skin behind the right ear, lets try a different cream, Lotrisone cream for the next 7-10 days.  If this is not improving, then stop the cream and just use olive oil topically or lotion and follow up with dermatology

## 2023-06-22 ENCOUNTER — Other Ambulatory Visit: Payer: Self-pay | Admitting: Medical

## 2023-06-28 ENCOUNTER — Other Ambulatory Visit: Payer: Self-pay | Admitting: Nurse Practitioner

## 2023-06-28 DIAGNOSIS — R7303 Prediabetes: Secondary | ICD-10-CM

## 2023-06-28 DIAGNOSIS — E782 Mixed hyperlipidemia: Secondary | ICD-10-CM

## 2023-06-28 MED ORDER — ATORVASTATIN CALCIUM 10 MG PO TABS: 10.0000 mg | ORAL_TABLET | Freq: Every day | ORAL | 3 refills | Status: DC

## 2023-08-16 DIAGNOSIS — R1310 Dysphagia, unspecified: Secondary | ICD-10-CM | POA: Diagnosis not present

## 2023-10-25 ENCOUNTER — Ambulatory Visit (INDEPENDENT_AMBULATORY_CARE_PROVIDER_SITE_OTHER): Payer: 59 | Admitting: Nurse Practitioner

## 2023-10-25 ENCOUNTER — Encounter: Payer: Self-pay | Admitting: Nurse Practitioner

## 2023-10-25 VITALS — BP 124/82 | HR 78 | Ht 61.5 in | Wt 108.6 lb

## 2023-10-25 DIAGNOSIS — Z0000 Encounter for general adult medical examination without abnormal findings: Secondary | ICD-10-CM | POA: Diagnosis not present

## 2023-10-25 DIAGNOSIS — G479 Sleep disorder, unspecified: Secondary | ICD-10-CM | POA: Diagnosis not present

## 2023-10-25 DIAGNOSIS — E538 Deficiency of other specified B group vitamins: Secondary | ICD-10-CM | POA: Diagnosis not present

## 2023-10-25 DIAGNOSIS — E782 Mixed hyperlipidemia: Secondary | ICD-10-CM

## 2023-10-25 DIAGNOSIS — R1310 Dysphagia, unspecified: Secondary | ICD-10-CM | POA: Insufficient documentation

## 2023-10-25 DIAGNOSIS — J3089 Other allergic rhinitis: Secondary | ICD-10-CM

## 2023-10-25 DIAGNOSIS — J302 Other seasonal allergic rhinitis: Secondary | ICD-10-CM | POA: Diagnosis not present

## 2023-10-25 DIAGNOSIS — R7303 Prediabetes: Secondary | ICD-10-CM

## 2023-10-25 DIAGNOSIS — E559 Vitamin D deficiency, unspecified: Secondary | ICD-10-CM

## 2023-10-25 DIAGNOSIS — Z1231 Encounter for screening mammogram for malignant neoplasm of breast: Secondary | ICD-10-CM

## 2023-10-25 DIAGNOSIS — E611 Iron deficiency: Secondary | ICD-10-CM

## 2023-10-25 NOTE — Assessment & Plan Note (Signed)
 Currently managed with omega 3 fatty acids. Does not wish to take any medications. Information provided on high cholesterol and ways to help manage. We will recheck this today and watch carefully to ensure this is not getting higher.  - Watch the fat and cholesterol in your foods. Keep this very low to help reduce your cholesterol - Keep taking the omega 3 fatty acids - If this gets very high, we can talk about medication recommendations. High cholesterol can cause heart attack and stroke and we want to make sure we prevent this from happening.  - I will let you know what your labs show.

## 2023-10-25 NOTE — Progress Notes (Signed)
 Dell Fennel, DNP, AGNP-c [facility] [address] [place], [place] [address] Main Office [phone]  BP 124/82   Pulse 78   Ht 5' 1.5" (1.562 m)   Wt 108 lb 9.6 oz (49.3 kg)   BMI 20.19 kg/m    Subjective:    Patient ID: Terri Wall, female    DOB: 1966-06-19, 58 y.o.   MRN: [id number]  HPI: Terri Wall is a 58 y.o. female presenting on 10/25/2023 for comprehensive medical examination.   History of Present Illness Terri Wall is a 58 year old female who presents with allergy symptoms and concerns about cholesterol.  She experiences severe allergy symptoms, including nasal pruritus, rhinorrhea, and occasional cough, particularly during high pollen seasons. She finds some relief using honey as a natural remedy but has not been using any over-the-counter allergy medications recently. No fever or cold symptoms are present.  She is concerned about her cholesterol levels despite not consuming fatty foods. Her mother, aged 4, has no cholesterol issues, while her father passed away at 62 with gastrointestinal issues. She has been prescribed medication for cholesterol in the past but prefers to manage it through diet and lifestyle changes. She is due for routine blood work including cholesterol levels.  She experiences occasional sleep disturbances, waking up at 2-3 AM and being unable to return to sleep. She does not regularly consume chamomile tea or other sleep aids.  She reports occasional dizziness when standing up quickly, which she attributes to insufficient water intake, consuming only one bottle per day.  She has a history of sinus infections, with severe symptoms occurring about seven to eight years ago, including headaches and nasal discharge. She is concerned about recurrence but notes no current fever or significant pain.  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 The pain is not constant and is relieved by  stretching exercises.  She has a history of colon polyps, with two polyps removed during her first colonoscopy. Follow-up colonoscopies have been clear, with the next one scheduled in five years.  Pertinent items are noted in HPI.    Most Recent Depression Screen:     10/25/2023    8:10 AM 06/08/2023    9:04 AM 10/19/2022    9:00 AM 10/14/2021    9:37 AM 07/08/2020    2:20 PM  Depression screen PHQ 2/9  Decreased Interest 0 0 0 0 0  Down, Depressed, Hopeless 0 0 0 0 0  PHQ - 2 Score 0 0 0 0 0   Most Recent Anxiety Screen:      No data to display         Most Recent Fall Screen:    10/25/2023    8:10 AM 06/08/2023    9:03 AM 10/19/2022    9:00 AM 10/14/2021    9:37 AM 09/02/2021    9:53 AM  Fall Risk   Falls in the past year? 0 0 0 0 0  Number falls in past yr: 0 0 0 0 0  Injury with Fall? 0 0 0 0 0  Risk for fall due to : No Fall Risks No Fall Risks No Fall Risks No Fall Risks No Fall Risks  Follow up Falls evaluation completed Falls evaluation completed Falls evaluation completed Falls evaluation completed Falls evaluation completed    Past medical history, surgical history, medications, allergies, family history and social history reviewed with patient today and changes made to appropriate areas of the chart.  Past Medical  History:  Past Medical History:  Diagnosis Date   Adjustment disorder with mixed anxiety and depressed mood 03/22/2015   Allergy    Anxiety    Asymptomatic microscopic hematuria 05/31/2018   Body mass index (BMI) of 19.0 to 19.9 in adult 10/14/2021   GERD (gastroesophageal reflux disease) 12/06/2013   History of COVID-19 12/12/2019   History of COVID-19 12/12/2019   History of positive PPD, treatment status unknown    Reflux    Routine medical exam 10/19/2022   Medications:  Current Outpatient Medications on File Prior to Visit  Medication Sig   Cholecalciferol (VITAMIN D3) 50 MCG (2000 UT) CAPS Take by mouth.   Multiple Vitamins-Minerals  (MULTIVITAMIN WITH MINERALS) tablet Take 1 tablet by mouth daily. Reported on 09/30/2015   Omega-3 Fatty Acids (FISH OIL) 300 MG CAPS Take by mouth.   No current facility-administered medications on file prior to visit.   Surgical History:  History reviewed. No pertinent surgical history. Allergies:  Allergies  Allergen Reactions   Shingrix [Zoster Vac Recomb Adjuvanted] Hives    Severe reaction of widespread hives and pruritus.    Doxycycline Hives   Penicillins     Pharmacist reports hives, and trouble breathing   Tetracyclines & Related Hives   Family History:  History reviewed. No pertinent family history.     Objective:    BP 124/82   Pulse 78   Ht 5' 1.5" (1.562 m)   Wt 108 lb 9.6 oz (49.3 kg)   BMI 20.19 kg/m   Wt Readings from Last 3 Encounters:  10/25/23 108 lb 9.6 oz (49.3 kg)  06/15/23 107 lb 6.4 oz (48.7 kg)  06/08/23 109 lb (49.4 kg)    Physical Exam Vitals and nursing note reviewed.  Constitutional:      General: She is not in acute distress.    Appearance: Normal appearance.  HENT:     Head: Normocephalic and atraumatic.     Right Ear: Hearing, ear canal and external ear normal. A middle ear effusion is present.     Left Ear: Hearing, ear canal and external ear normal. A middle ear effusion is present.     Ears:     Comments: Clear effusion present.     Nose: Congestion present.     Right Sinus: No maxillary sinus tenderness or frontal sinus tenderness.     Left Sinus: No maxillary sinus tenderness or frontal sinus tenderness.     Mouth/Throat:     Lips: Pink.     Mouth: Mucous membranes are moist.     Pharynx: Oropharynx is clear.  Eyes:     General: Lids are normal. Vision grossly intact.     Extraocular Movements: Extraocular movements intact.     Conjunctiva/sclera: Conjunctivae normal.     Pupils: Pupils are equal, round, and reactive to light.     Funduscopic exam:    Right eye: Red reflex present.        Left eye: Red reflex present.     Visual Fields: Right eye visual fields normal and left eye visual fields normal.  Neck:     Thyroid: No thyromegaly.     Vascular: No carotid bruit.  Cardiovascular:     Rate and Rhythm: Normal rate and regular rhythm.     Chest Wall: PMI is not displaced.     Pulses: Normal pulses.          Dorsalis pedis pulses are 2+ on the right side and 2+ on the left  side.       Posterior tibial pulses are 2+ on the right side and 2+ on the left side.     Heart sounds: Normal heart sounds. No murmur heard. Pulmonary:     Effort: Pulmonary effort is normal. No respiratory distress.     Breath sounds: Normal breath sounds.  Abdominal:     General: Abdomen is flat. Bowel sounds are normal. There is no distension.     Palpations: Abdomen is soft. There is no hepatomegaly, splenomegaly or mass.     Tenderness: There is no abdominal tenderness. There is no right CVA tenderness, left CVA tenderness, guarding or rebound.  Musculoskeletal:        General: Normal range of motion.     Cervical back: Full passive range of motion without pain, normal range of motion and neck supple. No tenderness.     Right lower leg: No edema.     Left lower leg: No edema.  Feet:     Left foot:     Toenail Condition: Left toenails are normal.  Lymphadenopathy:     Cervical: No cervical adenopathy.     Upper Body:     Right upper body: No supraclavicular adenopathy.     Left upper body: No supraclavicular adenopathy.  Skin:    General: Skin is warm and dry.     Capillary Refill: Capillary refill takes less than 2 seconds.     Nails: There is no clubbing.  Neurological:     General: No focal deficit present.     Mental Status: She is alert and oriented to person, place, and time.     GCS: GCS eye subscore is 4. GCS verbal subscore is 5. GCS motor subscore is 6.     Sensory: Sensation is intact.     Motor: Motor function is intact.     Coordination: Coordination is intact.     Gait: Gait is intact.     Deep Tendon  Reflexes: Reflexes are normal and symmetric.  Psychiatric:        Attention and Perception: Attention normal.        Mood and Affect: Mood normal.        Speech: Speech normal.        Behavior: Behavior normal. Behavior is cooperative.        Thought Content: Thought content normal.        Cognition and Memory: Cognition and memory normal.        Judgment: Judgment normal.      Results for orders placed or performed in visit on 06/08/23  Hemoglobin A1c   Collection Time: 06/08/23 10:32 AM  Result Value Ref Range   Hgb A1c MFr Bld 5.7 (H) 4.8 - 5.6 %   Est. average glucose Bld gHb Est-mCnc 117 mg/dL  CBC with Differential/Platelet   Collection Time: 06/08/23 10:32 AM  Result Value Ref Range   WBC 6.4 3.4 - 10.8 x10E3/uL   RBC 4.81 3.77 - 5.28 x10E6/uL   Hemoglobin 14.6 11.1 - 15.9 g/dL   Hematocrit 16.1 09.6 - 46.6 %   MCV 90 79 - 97 fL   MCH 30.4 26.6 - 33.0 pg   MCHC 33.6 31.5 - 35.7 g/dL   RDW 04.5 40.9 - 81.1 %   Platelets 288 150 - 450 x10E3/uL   Neutrophils 61 Not Estab. %   Lymphs 31 Not Estab. %   Monocytes 6 Not Estab. %   Eos 1 Not Estab. %  Basos 1 Not Estab. %   Neutrophils Absolute 4.0 1.4 - 7.0 x10E3/uL   Lymphocytes Absolute 2.0 0.7 - 3.1 x10E3/uL   Monocytes Absolute 0.4 0.1 - 0.9 x10E3/uL   EOS (ABSOLUTE) 0.1 0.0 - 0.4 x10E3/uL   Basophils Absolute 0.0 0.0 - 0.2 x10E3/uL   Immature Granulocytes 0 Not Estab. %   Immature Grans (Abs) 0.0 0.0 - 0.1 x10E3/uL  Comprehensive metabolic panel   Collection Time: 06/08/23 10:32 AM  Result Value Ref Range   Glucose 91 70 - 99 mg/dL   BUN 15 6 - 24 mg/dL   Creatinine, Ser 0.86 0.57 - 1.00 mg/dL   eGFR 91 >57 QI/ONG/2.95   BUN/Creatinine Ratio 20 9 - 23   Sodium 141 134 - 144 mmol/L   Potassium 4.2 3.5 - 5.2 mmol/L   Chloride 104 96 - 106 mmol/L   CO2 19 (L) 20 - 29 mmol/L   Calcium 9.8 8.7 - 10.2 mg/dL   Total Protein 7.3 6.0 - 8.5 g/dL   Albumin 4.7 3.8 - 4.9 g/dL   Globulin, Total 2.6 1.5 - 4.5 g/dL    Bilirubin Total 0.8 0.0 - 1.2 mg/dL   Alkaline Phosphatase 85 44 - 121 IU/L   AST 21 0 - 40 IU/L   ALT 22 0 - 32 IU/L  Lipid panel   Collection Time: 06/08/23 10:32 AM  Result Value Ref Range   Cholesterol, Total 231 (H) 100 - 199 mg/dL   Triglycerides 284 (H) 0 - 149 mg/dL   HDL 54 >13 mg/dL   VLDL Cholesterol Cal 41 (H) 5 - 40 mg/dL   LDL Chol Calc (NIH) 244 (H) 0 - 99 mg/dL   Chol/HDL Ratio 4.3 0.0 - 4.4 ratio  VITAMIN D 25 Hydroxy (Vit-D Deficiency, Fractures)   Collection Time: 06/08/23 10:32 AM  Result Value Ref Range   Vit D, 25-Hydroxy 53.6 30.0 - 100.0 ng/mL  Vitamin B12   Collection Time: 06/08/23 10:32 AM  Result Value Ref Range   Vitamin B-12 490 232 - 1,245 pg/mL  Iron, TIBC and Ferritin Panel   Collection Time: 06/08/23 10:32 AM  Result Value Ref Range   Total Iron Binding Capacity 345 250 - 450 ug/dL   UIBC 010 272 - 536 ug/dL   Iron 90 27 - 644 ug/dL   Iron Saturation 26 15 - 55 %   Ferritin 247 (H) 15 - 150 ng/mL  TSH   Collection Time: 06/08/23 10:32 AM  Result Value Ref Range   TSH 1.780 0.450 - 4.500 uIU/mL  T4, free   Collection Time: 06/08/23 10:32 AM  Result Value Ref Range   Free T4 1.30 0.82 - 1.77 ng/dL       Assessment & Plan:   Problem List Items Addressed This Visit     Vitamin D deficiency   Will recheck labs today. Currently on 2000IU over the counter supplement.       Relevant Orders   VITAMIN D 25 Hydroxy (Vit-D Deficiency, Fractures)   Environmental and seasonal allergies   Symptoms of rhinorrhea and pressure present. Currently utilizing local honey and lemon water to help with symptoms. Discussed over the counter treatment options that can help including levocetirizine and saline nasal spray daily for additional control.  - Try saline nasal spray once or twice a day to help flush the nose of pollen.  - Levocetirizine (also called Xyzal) can be purchased over the counter at the pharmacy for allergies. Be sure to take this at  bedtime as it will make you sleepy - Keep using local honey. This will help.       Mixed hyperlipidemia   Currently managed with omega 3 fatty acids. Does not wish to take any medications. Information provided on high cholesterol and ways to help manage. We will recheck this today and watch carefully to ensure this is not getting higher.  - Watch the fat and cholesterol in your foods. Keep this very low to help reduce your cholesterol - Keep taking the omega 3 fatty acids - If this gets very high, we can talk about medication recommendations. High cholesterol can cause heart attack and stroke and we want to make sure we prevent this from happening.  - I will let you know what your labs show.       Relevant Orders   Lipid panel   Encounter for annual physical exam - Primary   CPE completed today. Review of HM activities and recommendations discussed and provided on AVS. Anticipatory guidance, diet, and exercise recommendations provided. Medications, allergies, and hx reviewed and updated as necessary. Orders placed as listed below.  Plan: - Labs ordered. Will make changes as necessary based on results.  - Mammogram ordered for September - Due for pap next year - Decline vaccines.  - I will review these results and send recommendations via MyChart or a telephone call.  - F/U with CPE in 1 year or sooner for acute/chronic health needs as directed.        Prediabetes   Recheck labs today. No known family history. Recommend low carb, low fat meal options.  - Be sure to watch the sugar in your foods.  - I will ley you know what your labs show      Relevant Orders   CMP14+EGFR   Hemoglobin A1c   Sleep disturbance   Discussed camomile tea and melatonin to help with sleep. Disturbances with "mind racing" reported with nighttime awakening. She does not wish to take medications.  - Try camomile tea with honey at bedtime or when you wake up. This does not have caffeine and can help you rest.   - You can also try melatonin 5mg  if you need to to help with sleep. This will not make you overly sleepy and is found in the vitamin section at the pharmacy.       Other Visit Diagnoses       Seasonal allergies         B12 deficiency       Relevant Orders   Vitamin B12     Iron deficiency       Relevant Orders   CBC with Differential/Platelet   CMP14+EGFR   Iron, TIBC and Ferritin Panel     Screening mammogram for breast cancer       Relevant Orders   MM 3D SCREENING MAMMOGRAM BILATERAL BREAST         Follow up plan: Return in about 1 year (around 10/24/2024) for CPE and Pap.  NEXT PREVENTATIVE PHYSICAL DUE IN 1 YEAR.  PATIENT COUNSELING PROVIDED FOR ALL ADULT PATIENTS: A well balanced diet low in saturated fats, cholesterol, and moderation in carbohydrates.  This can be as simple as monitoring portion sizes and cutting back on sugary beverages such as soda and juice to start with.    Daily water consumption of at least 64 ounces.  Physical activity at least 180 minutes per week.  If just starting out, start 10 minutes a day and work your  way up.   This can be as simple as taking the stairs instead of the elevator and walking 2-3 laps around the office  purposefully every day.   STD protection, partner selection, and regular testing if high risk.  Limited consumption of alcoholic beverages if alcohol is consumed. For men, I recommend no more than 14 alcoholic beverages per week, spread out throughout the week (max 2 per day). Avoid "binge" drinking or consuming large quantities of alcohol in one setting.  Please let me know if you feel you may need help with reduction or quitting alcohol consumption.   Avoidance of nicotine, if used. Please let me know if you feel you may need help with reduction or quitting nicotine use.   Daily mental health attention. This can be in the form of 5 minute daily meditation, prayer, journaling, yoga, reflection, etc.  Purposeful  attention to your emotions and mental state can significantly improve your overall wellbeing  and  Health.  Please know that I am here to help you with all of your health care goals and am happy to work with you to find a solution that works best for you.  The greatest advice I have received with any changes in life are to take it one step at a time, that even means if all you can focus on is the next 60 seconds, then do that and celebrate your victories.  With any changes in life, you will have set backs, and that is OK. The important thing to remember is, if you have a set back, it is not a failure, it is an opportunity to try again! Screening Testing Mammogram Every 1 -2 years based on history and risk factors Starting at age 74 Pap Smear Ages 21-39 every 3 years Ages 80-65 every 5 years with HPV testing More frequent testing may be required based on results and history Colon Cancer Screening Every 1-10 years based on test performed, risk factors, and history Starting at age 42 Bone Density Screening Every 2-10 years based on history Starting at age 32 for women Recommendations for men differ based on medication usage, history, and risk factors AAA Screening One time ultrasound Men 12-30 years old who have every smoked Lung Cancer Screening Low Dose Lung CT every 12 months Age 90-80 years with a 30 pack-year smoking history who still smoke or who have quit within the last 15 years   Screening Labs Routine  Labs: Complete Blood Count (CBC), Complete Metabolic Panel (CMP), Cholesterol (Lipid Panel) Every 6-12 months based on history and medications May be recommended more frequently based on current conditions or previous results Hemoglobin A1c Lab Every 3-12 months based on history and previous results Starting at age 35 or earlier with diagnosis of diabetes, high cholesterol, BMI >26, and/or risk factors Frequent monitoring for patients with diabetes to ensure blood sugar  control Thyroid Panel (TSH) Every 6 months based on history, symptoms, and risk factors May be repeated more often if on medication HIV One time testing for all patients 24 and older May be repeated more frequently for patients with increased risk factors or exposure Hepatitis C One time testing for all patients 40 and older May be repeated more frequently for patients with increased risk factors or exposure Gonorrhea, Chlamydia Every 12 months for all sexually active persons 13-24 years Additional monitoring may be recommended for those who are considered high risk or who have symptoms Every 12 months for any woman on birth control, regardless of sexual  activity PSA Men 44-4 years old with risk factors Additional screening may be recommended from age 74-69 based on risk factors, symptoms, and history  Vaccine Recommendations Tetanus Booster All adults every 10 years Flu Vaccine All patients 6 months and older every year COVID Vaccine All patients 12 years and older Initial dosing with booster May recommend additional booster based on age and health history HPV Vaccine 2 doses all patients age 95-26 Dosing may be considered for patients over 26 Shingles Vaccine (Shingrix) 2 doses all adults 55 years and older Pneumonia (Pneumovax 62) All adults 65 years and older May recommend earlier dosing based on health history One year apart from Prevnar 13 Pneumonia (Prevnar 66) All adults 65 years and older Dosed 1 year after Pneumovax 23 Pneumonia (Prevnar 20) One time alternative to the two dosing of 13 and 23 For all adults with initial dose of 23, 20 is recommended 1 year later For all adults with initial dose of 13, 23 is still recommended as second option 1 year later

## 2023-10-25 NOTE — Assessment & Plan Note (Signed)
 Will recheck labs today. Currently on 2000IU over the counter supplement.

## 2023-10-25 NOTE — Assessment & Plan Note (Signed)
 Recheck labs today. No known family history. Recommend low carb, low fat meal options.  - Be sure to watch the sugar in your foods.  - I will ley you know what your labs show

## 2023-10-25 NOTE — Assessment & Plan Note (Signed)
 Discussed camomile tea and melatonin to help with sleep. Disturbances with "mind racing" reported with nighttime awakening. She does not wish to take medications.  - Try camomile tea with honey at bedtime or when you wake up. This does not have caffeine and can help you rest.  - You can also try melatonin 5mg  if you need to to help with sleep. This will not make you overly sleepy and is found in the vitamin section at the pharmacy.

## 2023-10-25 NOTE — Patient Instructions (Addendum)
 You look fabulous today!! Your blood pressure is very good. I will let you know what your lab work shows and if we need to change anything I will let you know.   Keep taking the fish oil for your cholesterol.   For Allergies:  You can try simply saline nasal spray at bedtime to help flush out the sinuses and remove any pollen in the nose.   You may also want to try Xyzal (levocetirizine) by mouth at bedtime to help with the allergies and runny nose. The generic is just as good as the brand name.   Keep using honey, warm water, and even decaffeinated tea to help the throat and allergies. The local honey is very good for helping with this.   Sleep Problems:  You can try camomile tea if you have problems sleeping or wake up in the middle of the night. This does not have anything that will keep you awake and will help your body relax.  You can also try melatonin, which is over the counter, that is a natural supplement to help with sleep.   Health Maintenance:  I ordered your mammogram for September. They will call you to schedule when it is time.   Your pap smear will be due next year. We can do that at your next physical.   Be sure are drinking more water every day. This is very important for your health and can help with keeping your blood sugar down.   Shoulder:  Keep working on your shoulder stretches. If this bothers you more, we can send a referral to the orthopedic doctor.    High Cholesterol  High cholesterol is a condition in which the blood has high levels of a white, waxy substance similar to fat (cholesterol). The liver makes all the cholesterol that the body needs. The human body needs small amounts of cholesterol to help build cells. A person gets extra or excess cholesterol from the food that he or she eats. The blood carries cholesterol from the liver to the rest of the body. If you have high cholesterol, deposits (plaques) may build up on the walls of your arteries.  Arteries are the blood vessels that carry blood away from your heart. These plaques make the arteries narrow and stiff. Cholesterol plaques increase your risk for heart attack and stroke. Work with your health care provider to keep your cholesterol levels in a healthy range. What increases the risk? The following factors may make you more likely to develop this condition: Eating foods that are high in animal fat (saturated fat) or cholesterol. Being overweight. Not getting enough exercise. A family history of high cholesterol (familial hypercholesterolemia). Use of tobacco products. Having diabetes. What are the signs or symptoms? In most cases, high cholesterol does not usually cause any symptoms. In severe cases, very high cholesterol levels can cause: Fatty bumps under the skin (xanthomas). A white or gray ring around the black center (pupil) of the eye. How is this diagnosed? This condition may be diagnosed based on the results of a blood test. If you are older than 59 years of age, your health care provider may check your cholesterol levels every 4-6 years. You may be checked more often if you have high cholesterol or other risk factors for heart disease. The blood test for cholesterol measures: "Bad" cholesterol, or LDL cholesterol. This is the main type of cholesterol that causes heart disease. The desired level is less than 100 mg/dL (7.82 mmol/L). "Good" cholesterol, or  HDL cholesterol. HDL helps protect against heart disease by cleaning the arteries and carrying the LDL to the liver for processing. The desired level for HDL is 60 mg/dL (0.45 mmol/L) or higher. Triglycerides. These are fats that your body can store or burn for energy. The desired level is less than 150 mg/dL (4.09 mmol/L). Total cholesterol. This measures the total amount of cholesterol in your blood and includes LDL, HDL, and triglycerides. The desired level is less than 200 mg/dL (8.11 mmol/L). How is this  treated? Treatment for high cholesterol starts with lifestyle changes, such as diet and exercise. Diet changes. You may be asked to eat foods that have more fiber and less saturated fats or added sugar. Lifestyle changes. These may include regular exercise, maintaining a healthy weight, and quitting use of tobacco products. Medicines. These are given when diet and lifestyle changes have not worked. You may be prescribed a statin medicine to help lower your cholesterol levels. Follow these instructions at home: Eating and drinking  Eat a healthy, balanced diet. This diet includes: Daily servings of a variety of fresh, frozen, or canned fruits and vegetables. Daily servings of whole grain foods that are rich in fiber. Foods that are low in saturated fats and trans fats. These include poultry and fish without skin, lean cuts of meat, and low-fat dairy products. A variety of fish, especially oily fish that contain omega-3 fatty acids. Aim to eat fish at least 2 times a week. Avoid foods and drinks that have added sugar. Use healthy cooking methods, such as roasting, grilling, broiling, baking, poaching, steaming, and stir-frying. Do not fry your food except for stir-frying. If you drink alcohol: Limit how much you have to: 0-1 drink a day for women who are not pregnant. 0-2 drinks a day for men. Know how much alcohol is in a drink. In the [place] one drink equals one 12 oz bottle of beer (355 mL), one 5 oz glass of wine (148 mL), or one 1 oz glass of hard liquor (44 mL). Lifestyle  Get regular exercise. Aim to exercise for a total of 150 minutes a week. Increase your activity level by doing activities such as gardening, walking, and taking the stairs. Do not use any products that contain nicotine or tobacco. These products include cigarettes, chewing tobacco, and vaping devices, such as e-cigarettes. If you need help quitting, ask your health care provider. General instructions Take  over-the-counter and prescription medicines only as told by your health care provider. Keep all follow-up visits. This is important. Where to find more information American Heart Association: www.heart.org National Heart, Lung, and Blood Institute: PopSte[employer].is Contact a health care provider if: You have trouble achieving or maintaining a healthy diet or weight. You are starting an exercise program. You are unable to stop smoking. Get help right away if: You have chest pain. You have trouble breathing. You have discomfort or pain in your jaw, neck, back, shoulder, or arm. You have any symptoms of a stroke. "BE FAST" is an easy way to remember the main warning signs of a stroke: B - Balance. Signs are dizziness, sudden trouble walking, or loss of balance. E - Eyes. Signs are trouble seeing or a sudden change in vision. F - Face. Signs are sudden weakness or numbness of the face, or the face or eyelid drooping on one side. A - Arms. Signs are weakness or numbness in an arm. This happens suddenly and usually on one side of the body. S - Speech. Signs  are sudden trouble speaking, slurred speech, or trouble understanding what people say. T - Time. Time to call emergency services. Write down what time symptoms started. You have other signs of a stroke, such as: A sudden, severe headache with no known cause. Nausea or vomiting. Seizure. These symptoms may represent a serious problem that is an emergency. Do not wait to see if the symptoms will go away. Get medical help right away. Call your local emergency services (911 in the [place] Do not drive yourself to the hospital. Summary Cholesterol plaques increase your risk for heart attack and stroke. Work with your health care provider to keep your cholesterol levels in a healthy range. Eat a healthy, balanced diet, get regular exercise, and maintain a healthy weight. Do not use any products that contain nicotine or tobacco. These products  include cigarettes, chewing tobacco, and vaping devices, such as e-cigarettes. Get help right away if you have any symptoms of a stroke. This information is not intended to replace advice given to you by your health care provider. Make sure you discuss any questions you have with your health care provider. Document Revised: 01/30/2022 Document Reviewed: 09/02/2020 Elsevier Patient Education  2024 [employer].

## 2023-10-25 NOTE — Assessment & Plan Note (Signed)
 Symptoms of rhinorrhea and pressure present. Currently utilizing local honey and lemon water to help with symptoms. Discussed over the counter treatment options that can help including levocetirizine and saline nasal spray daily for additional control.  - Try saline nasal spray once or twice a day to help flush the nose of pollen.  - Levocetirizine (also called Xyzal) can be purchased over the counter at the pharmacy for allergies. Be sure to take this at bedtime as it will make you sleepy - Keep using local honey. This will help.

## 2023-10-25 NOTE — Assessment & Plan Note (Signed)
 CPE completed today. Review of HM activities and recommendations discussed and provided on AVS. Anticipatory guidance, diet, and exercise recommendations provided. Medications, allergies, and hx reviewed and updated as necessary. Orders placed as listed below.  Plan: - Labs ordered. Will make changes as necessary based on results.  - Mammogram ordered for September - Due for pap next year - Decline vaccines.  - I will review these results and send recommendations via MyChart or a telephone call.  - F/U with CPE in 1 year or sooner for acute/chronic health needs as directed.

## 2023-10-26 LAB — CBC WITH DIFFERENTIAL/PLATELET
Basophils Absolute: 0 10*3/uL (ref 0.0–0.2)
Basos: 0 %
EOS (ABSOLUTE): 0.1 10*3/uL (ref 0.0–0.4)
Eos: 2 %
Hematocrit: 42.6 % (ref 34.0–46.6)
Hemoglobin: 13.7 g/dL (ref 11.1–15.9)
Immature Grans (Abs): 0 10*3/uL (ref 0.0–0.1)
Immature Granulocytes: 0 %
Lymphocytes Absolute: 2.3 10*3/uL (ref 0.7–3.1)
Lymphs: 38 %
MCH: 29.1 pg (ref 26.6–33.0)
MCHC: 32.2 g/dL (ref 31.5–35.7)
MCV: 91 fL (ref 79–97)
Monocytes Absolute: 0.3 10*3/uL (ref 0.1–0.9)
Monocytes: 5 %
Neutrophils Absolute: 3.2 10*3/uL (ref 1.4–7.0)
Neutrophils: 55 %
Platelets: 343 10*3/uL (ref 150–450)
RBC: 4.7 x10E6/uL (ref 3.77–5.28)
RDW: 11.9 % (ref 11.7–15.4)
WBC: 5.9 10*3/uL (ref 3.4–10.8)

## 2023-10-26 LAB — VITAMIN D 25 HYDROXY (VIT D DEFICIENCY, FRACTURES): Vit D, 25-Hydroxy: 71.1 ng/mL (ref 30.0–100.0)

## 2023-10-26 LAB — CMP14+EGFR
ALT: 65 IU/L — ABNORMAL HIGH (ref 0–32)
AST: 45 IU/L — ABNORMAL HIGH (ref 0–40)
Albumin: 4.6 g/dL (ref 3.8–4.9)
Alkaline Phosphatase: 97 IU/L (ref 44–121)
BUN/Creatinine Ratio: 18 (ref 9–23)
BUN: 12 mg/dL (ref 6–24)
Bilirubin Total: 0.9 mg/dL (ref 0.0–1.2)
CO2: 22 mmol/L (ref 20–29)
Calcium: 9.9 mg/dL (ref 8.7–10.2)
Chloride: 103 mmol/L (ref 96–106)
Creatinine, Ser: 0.67 mg/dL (ref 0.57–1.00)
Globulin, Total: 2.7 g/dL (ref 1.5–4.5)
Glucose: 96 mg/dL (ref 70–99)
Potassium: 4.2 mmol/L (ref 3.5–5.2)
Sodium: 140 mmol/L (ref 134–144)
Total Protein: 7.3 g/dL (ref 6.0–8.5)
eGFR: 102 mL/min/{1.73_m2} (ref >59)

## 2023-10-26 LAB — HEMOGLOBIN A1C
Est. average glucose Bld gHb Est-mCnc: 114 mg/dL
Hgb A1c MFr Bld: 5.6 % (ref 4.8–5.6)

## 2023-10-26 LAB — LIPID PANEL
Chol/HDL Ratio: 5 ratio — ABNORMAL HIGH (ref 0.0–4.4)
Cholesterol, Total: 241 mg/dL — ABNORMAL HIGH (ref 100–199)
HDL: 48 mg/dL (ref >39)
LDL Chol Calc (NIH): 154 mg/dL — ABNORMAL HIGH (ref 0–99)
Triglycerides: 214 mg/dL — ABNORMAL HIGH (ref 0–149)
VLDL Cholesterol Cal: 39 mg/dL (ref 5–40)

## 2023-10-26 LAB — IRON,TIBC AND FERRITIN PANEL
Ferritin: 253 ng/mL — ABNORMAL HIGH (ref 15–150)
Iron Saturation: 31 % (ref 15–55)
Iron: 104 ug/dL (ref 27–159)
Total Iron Binding Capacity: 338 ug/dL (ref 250–450)
UIBC: 234 ug/dL (ref 131–425)

## 2023-10-26 LAB — VITAMIN B12: Vitamin B-12: 637 pg/mL (ref 232–1245)

## 2023-10-28 ENCOUNTER — Other Ambulatory Visit: Payer: Self-pay | Admitting: Nurse Practitioner

## 2023-10-28 DIAGNOSIS — E782 Mixed hyperlipidemia: Secondary | ICD-10-CM

## 2023-10-28 DIAGNOSIS — R7989 Other specified abnormal findings of blood chemistry: Secondary | ICD-10-CM

## 2023-11-01 NOTE — Telephone Encounter (Unsigned)
 Copied from CRM [id number]. Topic: Clinical - Medication Question >> Nov 01, 2023  3:41 PM Rosaria Common wrote: Reason for CRM: Pt would like communication regarding her prescriptions sent via MyChart so she can understand due to language barrier. Callback number is [phone].

## 2023-11-02 ENCOUNTER — Telehealth: Payer: Self-pay | Admitting: Nurse Practitioner

## 2023-11-02 DIAGNOSIS — Z5320 Procedure and treatment not carried out because of patient's decision for unspecified reasons: Secondary | ICD-10-CM

## 2023-11-02 DIAGNOSIS — E782 Mixed hyperlipidemia: Secondary | ICD-10-CM

## 2023-11-02 DIAGNOSIS — Z9189 Other specified personal risk factors, not elsewhere classified: Secondary | ICD-10-CM

## 2023-11-02 NOTE — Addendum Note (Signed)
 Addended by: Jaimen Melone, Abraham Hoffmann E on: 11/02/2023 06:41 PM   Modules accepted: Orders

## 2023-11-02 NOTE — Telephone Encounter (Signed)
 Pt walked in the office, she states she does want to do the cardiology referral and wants to wait to start any medication at this time. I advised once referral is placed she should receive a call.

## 2023-11-16 ENCOUNTER — Ambulatory Visit (HOSPITAL_COMMUNITY)
Admission: RE | Admit: 2023-11-16 | Discharge: 2023-11-16 | Disposition: A | Discharge status: Home / Self Care | Source: Ambulatory Visit | Attending: Nurse Practitioner | Admitting: Nurse Practitioner

## 2023-11-16 DIAGNOSIS — R7989 Other specified abnormal findings of blood chemistry: Secondary | ICD-10-CM | POA: Diagnosis not present

## 2023-11-16 DIAGNOSIS — K769 Liver disease, unspecified: Secondary | ICD-10-CM | POA: Diagnosis not present

## 2023-11-16 DIAGNOSIS — E782 Mixed hyperlipidemia: Secondary | ICD-10-CM | POA: Insufficient documentation

## 2023-11-19 ENCOUNTER — Telehealth: Payer: Self-pay | Admitting: Internal Medicine

## 2023-11-19 NOTE — Telephone Encounter (Signed)
 Called patient back.  Sending to you to call patient again Monday.  Copied from CRM [id number]. Topic: General - Other >> Nov 19, 2023  4:01 PM Santiya F wrote: Reason for CRM: Patient is calling in returning a call from the office.

## 2023-11-22 NOTE — Telephone Encounter (Signed)
 Copied from CRM [id number]. Topic: General - Other >> Nov 19, 2023  4:51 PM Kevelyn M wrote: Reason for CRM: Patient is calling Sabrina back. Please give her a call back.

## 2023-11-23 ENCOUNTER — Other Ambulatory Visit: Payer: Self-pay

## 2023-11-23 DIAGNOSIS — K769 Liver disease, unspecified: Secondary | ICD-10-CM

## 2023-12-24 ENCOUNTER — Institutional Professional Consult (permissible substitution): Admitting: Nurse Practitioner

## 2023-12-28 ENCOUNTER — Ambulatory Visit
Admission: RE | Admit: 2023-12-28 | Discharge: 2023-12-28 | Disposition: A | Discharge status: Home / Self Care | Source: Ambulatory Visit | Attending: Nurse Practitioner | Admitting: Nurse Practitioner

## 2023-12-28 DIAGNOSIS — K769 Liver disease, unspecified: Secondary | ICD-10-CM

## 2023-12-28 DIAGNOSIS — D1803 Hemangioma of intra-abdominal structures: Secondary | ICD-10-CM | POA: Diagnosis not present

## 2023-12-28 MED ORDER — GADOPICLENOL 0.5 MMOL/ML IV SOLN
5.0000 mL | Freq: Once | INTRAVENOUS | Status: AC | PRN
  Administered 2023-12-28: 5 mL via INTRAVENOUS

## 2024-01-04 ENCOUNTER — Ambulatory Visit: Payer: Self-pay | Admitting: Nurse Practitioner

## 2024-01-11 ENCOUNTER — Encounter: Payer: Self-pay | Admitting: Nurse Practitioner

## 2024-01-11 ENCOUNTER — Ambulatory Visit: Admitting: Nurse Practitioner

## 2024-01-11 VITALS — BP 112/70 | HR 72 | Ht 62.0 in | Wt 105.4 lb

## 2024-01-11 DIAGNOSIS — Z0000 Encounter for general adult medical examination without abnormal findings: Secondary | ICD-10-CM

## 2024-01-11 DIAGNOSIS — R7989 Other specified abnormal findings of blood chemistry: Secondary | ICD-10-CM

## 2024-01-11 DIAGNOSIS — D1803 Hemangioma of intra-abdominal structures: Secondary | ICD-10-CM | POA: Diagnosis not present

## 2024-01-11 DIAGNOSIS — E782 Mixed hyperlipidemia: Secondary | ICD-10-CM

## 2024-01-11 NOTE — Patient Instructions (Signed)
 Keep taking the Omega 3 Fatty Acids. This will help with the cholesterol levels.  The magnesium glyconate is ok to take to help with sleep.   Look over the handout of food items to help with choices for what to eat.   Get at least 20 minutes of walking every day. This is very important.

## 2024-01-11 NOTE — Progress Notes (Signed)
 Terri Doing, DNP, AGNP-c [facility]  [address] [place], [place] [address] [phone]  ESTABLISHED PATIENT- Chronic Health and/or Follow-Up Visit  Blood pressure 112/70, pulse 72, height 5' 2 (1.575 m), weight 105 lb 6.4 oz (47.8 kg), SpO2 98%.   History of Present Illness Terri Wall is a 58 year old with high cholesterol who presents for follow-up on her lab results and liver health.  She is following up on her recent lab results, which showed elevated ferritin levels. She does not take iron supplements and only takes vitamin D  and a daily multivitamin. She is concerned about the high ferritin levels and inquires about potential causes, including inflammation and high cholesterol.  She has a history of very high cholesterol levels. No frequent consumption of fried foods, but triglycerides remain high. She is aware of the potential genetic component of her high cholesterol, as her father passed away at 57 years old, possibly due to heart issues. She takes omega-3 supplements and is mindful of her diet, avoiding fatty foods.  Her liver health was evaluated with an MRI, which showed a hemangioma. No signs of fatty liver were noted. She does not consume alcohol and only takes Tylenol  occasionally for headaches or fever.  She experiences occasional muscle pain, which she attributes to her work and sitting for long periods. The pain is not frequent and resolves on its own.  She sometimes has trouble sleeping and is interested in dietary changes to help manage her cholesterol and liver health, including the use of green tea.   All ROS negative with exception of what is listed above.   PHYSICAL EXAM Physical Exam Vitals and nursing note reviewed.  Constitutional:      Appearance: Normal appearance.  HENT:     Head: Normocephalic.  Eyes:     Pupils: Pupils are equal, round, and reactive to light.  Cardiovascular:     Rate and Rhythm: Normal rate and regular  rhythm.     Pulses: Normal pulses.     Heart sounds: Normal heart sounds.  Pulmonary:     Effort: Pulmonary effort is normal.     Breath sounds: Normal breath sounds.  Abdominal:     General: Abdomen is flat. Bowel sounds are normal. There is no distension.     Palpations: Abdomen is soft. There is no mass.     Tenderness: There is no abdominal tenderness. There is no guarding.  Musculoskeletal:        General: Normal range of motion.     Cervical back: Normal range of motion.  Skin:    General: Skin is warm.  Neurological:     General: No focal deficit present.     Mental Status: She is alert and oriented to person, place, and time.  Psychiatric:        Mood and Affect: Mood normal.      PLAN Mixed hyperlipidemia  Elevated ferritin  Elevated LFTs  Liver hemangioma  Healthcare maintenance   Hypercholesterolemia She has very high cholesterol levels, likely due to familial hypercholesterolemia, a genetic condition. Despite a healthy diet, her cholesterol remains elevated, causing inflammation as indicated by elevated ferritin levels. She is asymptomatic, but the risks of high cholesterol, including atherosclerosis, myocardial infarction, and cerebrovascular accident, were explained. She is currently taking omega-3 supplements, which can help lower cholesterol. - Continue omega-3 supplements - Provide dietary recommendations to reduce fatty food intake - Provide a list of foods low in cholesterol and fat - Encourage continuation of current  vitamin regimen including vitamin D  and omega-3 - Recheck cholesterol levels at next physical exam  Elevated Ferritin The elevated ferritin level is likely due to inflammation caused by high cholesterol levels. Other potential causes of elevated ferritin were considered, but no other concerning lab results were noted. She does not take iron supplements, which rules out iron overload as a cause.  Mildly Elevated Liver Enzymes She has a  slight elevation in liver enzymes, which could be due to occasional acetaminophen  use. She does not consume alcohol and takes acetaminophen  infrequently for headaches or fever. Acetaminophen  can cause temporary liver enzyme elevation. - Recheck liver enzyme levels at next physical exam  Liver Hemangioma The MRI showed a liver hemangioma, which is a benign cluster of blood vessels. It is not harmful and does not require treatment. The liver does not show signs of hepatic steatosis, which is positive given her high cholesterol levels.  General Health Maintenance She is generally healthy and is taking appropriate supplements. Advised to maintain a healthy diet and continue her current supplement regimen. Advised on the benefits of green tea for reducing inflammation. - Encourage consumption of green tea for its anti-inflammatory properties - Continue current vitamin and supplement regimen   Return if symptoms worsen or fail to improve, for already scheduled. SABRA Terri Doing, DNP, AGNP-c

## 2024-02-02 DIAGNOSIS — D1803 Hemangioma of intra-abdominal structures: Secondary | ICD-10-CM | POA: Insufficient documentation

## 2024-02-02 DIAGNOSIS — R7989 Other specified abnormal findings of blood chemistry: Secondary | ICD-10-CM | POA: Insufficient documentation

## 2024-04-10 ENCOUNTER — Ambulatory Visit

## 2024-11-13 ENCOUNTER — Encounter: Payer: Self-pay | Admitting: Nurse Practitioner
